# Patient Record
Sex: Male | Born: 1975
Health system: Southern US, Community
[De-identification: ages and names within clinical notes are randomized; demographics above are authoritative.]

## PROBLEM LIST (undated history)

## (undated) HISTORY — PX: TONSILLECTOMY: SUR1361

## (undated) HISTORY — PX: BACK SURGERY: SHX140

## (undated) HISTORY — PX: EYE SURGERY: SHX253

---

## 1997-07-19 ENCOUNTER — Ambulatory Visit (HOSPITAL_COMMUNITY): Admission: RE | Admit: 1997-07-19 | Discharge: 1997-07-19 | Payer: Self-pay

## 2000-12-08 ENCOUNTER — Encounter: Admission: RE | Admit: 2000-12-08 | Discharge: 2000-12-24 | Payer: Self-pay | Admitting: Occupational Medicine

## 2002-08-01 ENCOUNTER — Encounter: Admission: RE | Admit: 2002-08-01 | Discharge: 2002-10-30 | Payer: Self-pay | Admitting: Sports Medicine

## 2002-08-29 ENCOUNTER — Encounter: Admission: RE | Admit: 2002-08-29 | Discharge: 2002-08-29 | Payer: Self-pay | Admitting: Sports Medicine

## 2007-02-13 ENCOUNTER — Emergency Department (HOSPITAL_COMMUNITY): Admission: EM | Admit: 2007-02-13 | Discharge: 2007-02-13 | Payer: Self-pay | Admitting: Family Medicine

## 2008-01-14 ENCOUNTER — Emergency Department (HOSPITAL_COMMUNITY): Admission: EM | Admit: 2008-01-14 | Discharge: 2008-01-14 | Payer: Self-pay | Admitting: Family Medicine

## 2008-03-28 ENCOUNTER — Ambulatory Visit (HOSPITAL_COMMUNITY): Admission: RE | Admit: 2008-03-28 | Discharge: 2008-03-28 | Payer: Self-pay | Admitting: Anesthesiology

## 2008-06-07 ENCOUNTER — Encounter: Admission: RE | Admit: 2008-06-07 | Discharge: 2008-07-10 | Payer: Self-pay | Admitting: Neurosurgery

## 2008-12-29 ENCOUNTER — Encounter: Admission: RE | Admit: 2008-12-29 | Discharge: 2008-12-29 | Payer: Self-pay | Admitting: Neurosurgery

## 2009-11-13 ENCOUNTER — Emergency Department (HOSPITAL_COMMUNITY): Admission: EM | Admit: 2009-11-13 | Discharge: 2009-11-13 | Payer: Self-pay | Admitting: Emergency Medicine

## 2010-02-24 ENCOUNTER — Encounter: Payer: Self-pay | Admitting: Anesthesiology

## 2010-05-14 ENCOUNTER — Inpatient Hospital Stay (INDEPENDENT_AMBULATORY_CARE_PROVIDER_SITE_OTHER)
Admission: RE | Admit: 2010-05-14 | Discharge: 2010-05-14 | Disposition: A | Discharge status: Home / Self Care | Payer: 59 | Source: Ambulatory Visit | Attending: Emergency Medicine | Admitting: Emergency Medicine

## 2010-05-14 ENCOUNTER — Emergency Department (HOSPITAL_COMMUNITY): Payer: 59

## 2010-05-14 ENCOUNTER — Emergency Department (HOSPITAL_COMMUNITY)
Admission: EM | Admit: 2010-05-14 | Discharge: 2010-05-15 | Disposition: A | Discharge status: Home / Self Care | Payer: 59 | Attending: Emergency Medicine | Admitting: Emergency Medicine

## 2010-05-14 DIAGNOSIS — R197 Diarrhea, unspecified: Secondary | ICD-10-CM | POA: Insufficient documentation

## 2010-05-14 DIAGNOSIS — R0602 Shortness of breath: Secondary | ICD-10-CM | POA: Insufficient documentation

## 2010-05-14 DIAGNOSIS — K5289 Other specified noninfective gastroenteritis and colitis: Secondary | ICD-10-CM

## 2010-05-14 DIAGNOSIS — E860 Dehydration: Secondary | ICD-10-CM | POA: Insufficient documentation

## 2010-05-14 DIAGNOSIS — K922 Gastrointestinal hemorrhage, unspecified: Secondary | ICD-10-CM

## 2010-05-14 DIAGNOSIS — R0609 Other forms of dyspnea: Secondary | ICD-10-CM

## 2010-05-14 DIAGNOSIS — R112 Nausea with vomiting, unspecified: Secondary | ICD-10-CM | POA: Insufficient documentation

## 2010-05-14 LAB — DIFFERENTIAL
Basophils Absolute: 0 10*3/uL (ref 0.0–0.1)
Eosinophils Absolute: 0 10*3/uL (ref 0.0–0.7)
Eosinophils Relative: 0 % (ref 0–5)
Lymphocytes Relative: 20 % (ref 12–46)
Neutrophils Relative %: 71 % (ref 43–77)

## 2010-05-14 LAB — CBC
HCT: 43.3 % (ref 39.0–52.0)
Platelets: 231 10*3/uL (ref 150–400)
RBC: 5.31 MIL/uL (ref 4.22–5.81)
RDW: 13 % (ref 11.5–15.5)
WBC: 6.5 10*3/uL (ref 4.0–10.5)

## 2010-05-14 LAB — POCT I-STAT, CHEM 8
BUN: 17 mg/dL (ref 6–23)
Chloride: 99 mEq/L (ref 96–112)
Creatinine, Ser: 1.2 mg/dL (ref 0.4–1.5)
HCT: 47 % (ref 39.0–52.0)
Hemoglobin: 16 g/dL (ref 13.0–17.0)
Potassium: 3.4 mEq/L — ABNORMAL LOW (ref 3.5–5.1)
Potassium: 3.9 mEq/L (ref 3.5–5.1)
Sodium: 137 mEq/L (ref 135–145)
Sodium: 138 mEq/L (ref 135–145)
TCO2: 24 mmol/L (ref 0–100)

## 2010-05-14 LAB — OCCULT BLOOD, POC DEVICE: Fecal Occult Bld: POSITIVE

## 2010-05-15 LAB — URINALYSIS, ROUTINE W REFLEX MICROSCOPIC
Nitrite: NEGATIVE
Specific Gravity, Urine: 1.029 (ref 1.005–1.030)
Urobilinogen, UA: 1 mg/dL (ref 0.0–1.0)
pH: 5.5 (ref 5.0–8.0)

## 2010-09-06 IMAGING — CT CT L SPINE W/ CM
3 of 7 series · 12 of 27 positions shown, 13 images · non-contrast
Comparison: none

CLINICAL DATA: Back pain.  Multilevel degenerative disc disease.
TECHNIQUE: Contiguous axial images were obtained from the inferior
aspect of L2-L3 disc spacethrough S2. Coronal and sagittal
reconstructions of the disc spaces were created.

[Series 2: l spine bone · axial · 0.27mm/px · z∈[-228,-135]mm · 4 of 63 slices shown, 5 images]
[im 13/63  soft-tissue]
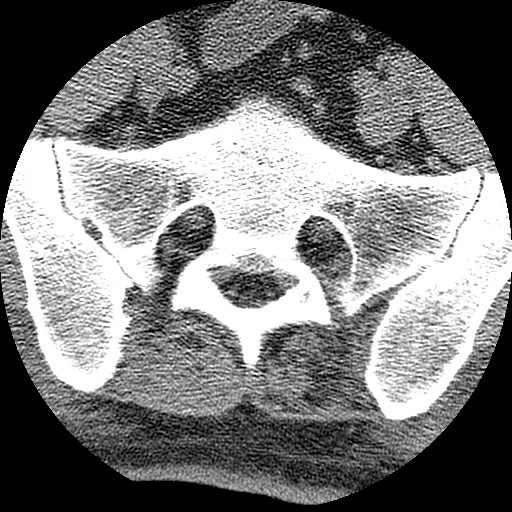
[im 13/63  bone]
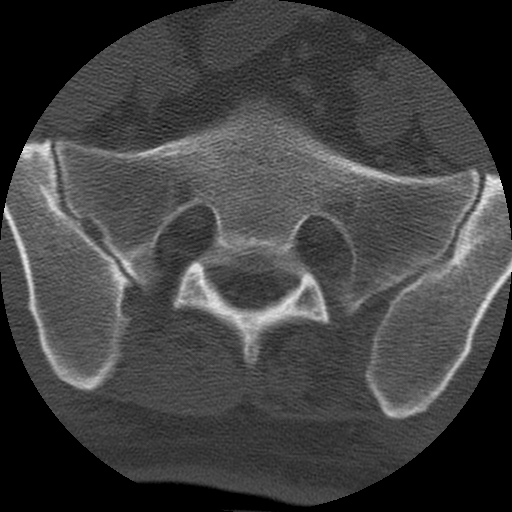
[im 25/63  bone]
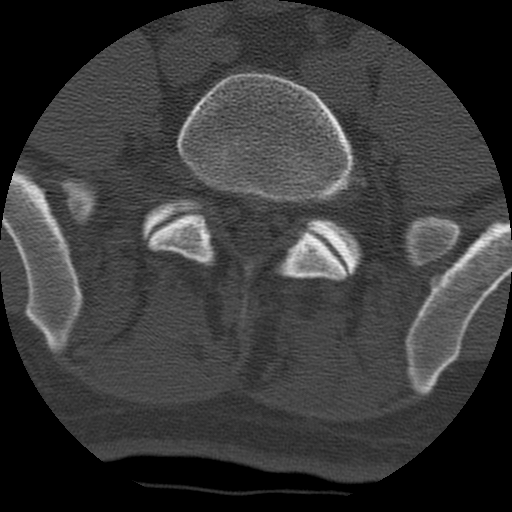
[im 38/63  bone]
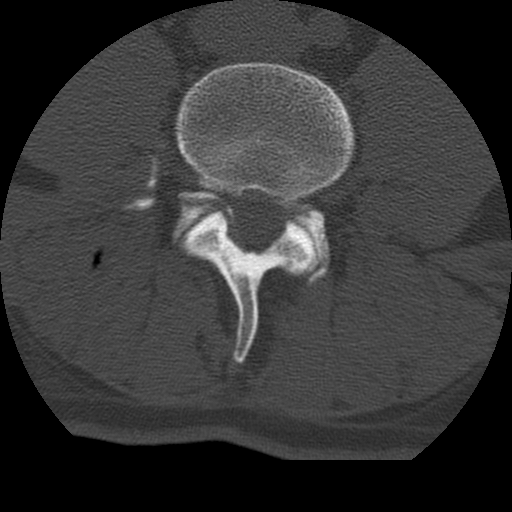
[im 50/63  bone]
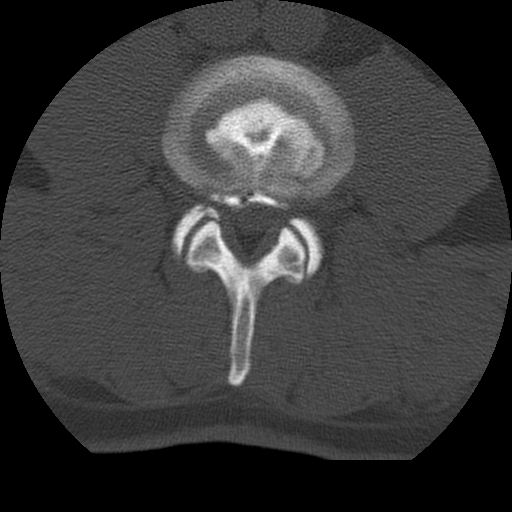

[Series 3: l spine soft · axial · 0.27mm/px · z∈[-220,-145]mm · 3 of 61 slices shown]
[im 16/61  soft-tissue]
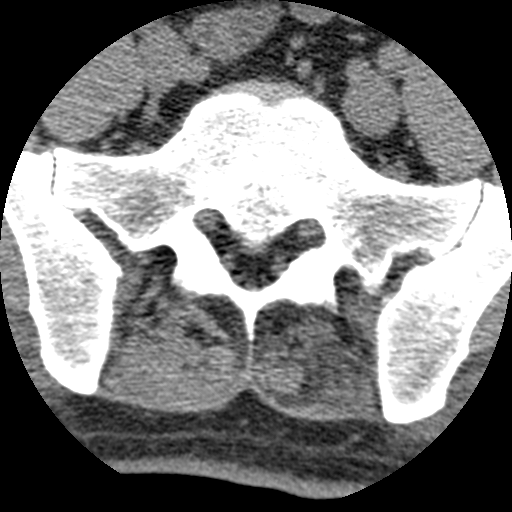
[im 31/61  soft-tissue]
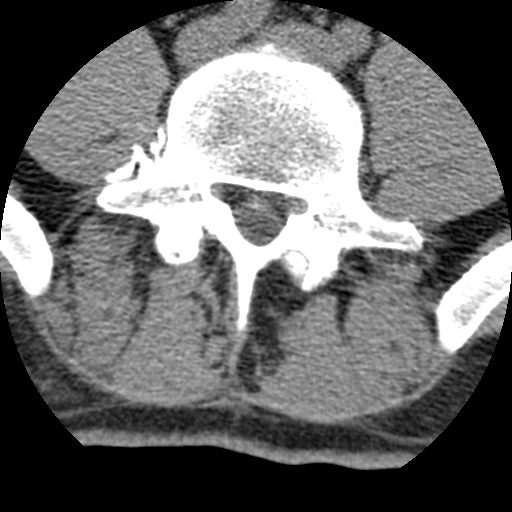
[im 46/61  soft-tissue]
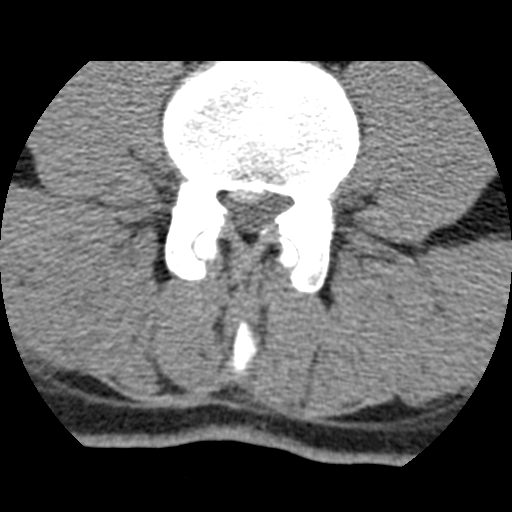

[Series 400: cor · coronal · 0.31mm/px · 5 of 40 slices shown]
[im 7/40  bone]
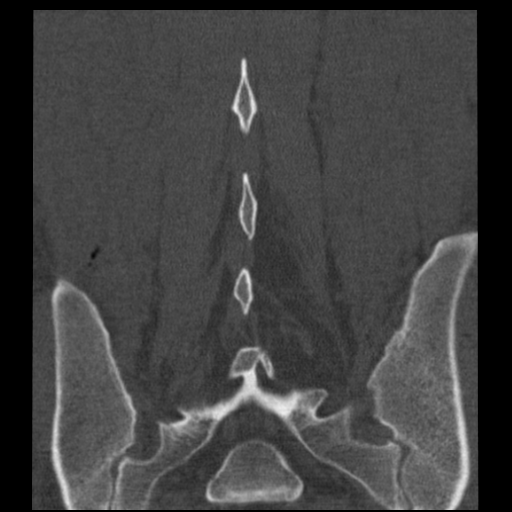
[im 14/40  bone]
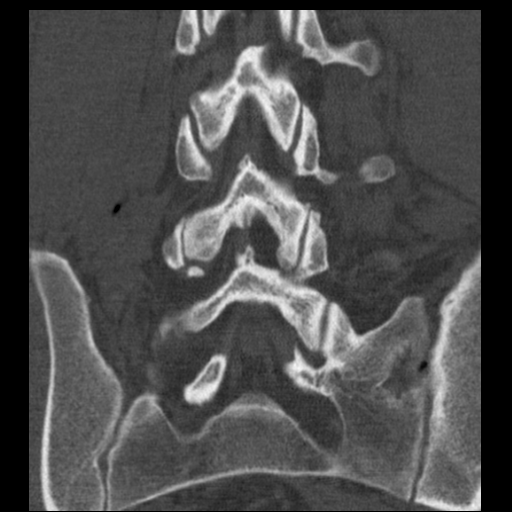
[im 20/40  bone]
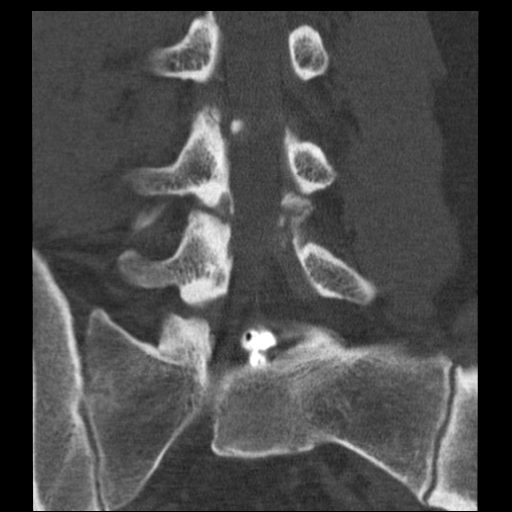
[im 27/40  bone]
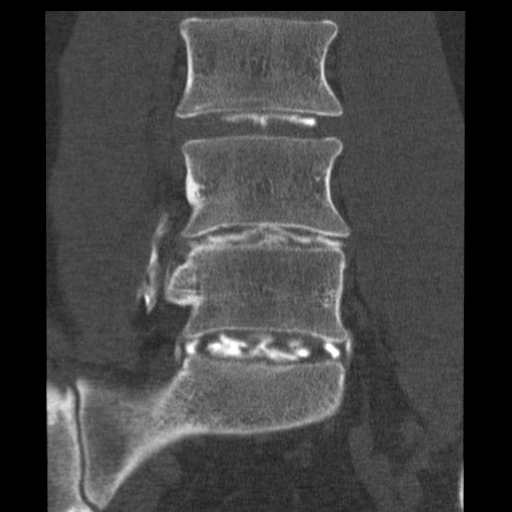
[im 33/40  bone]
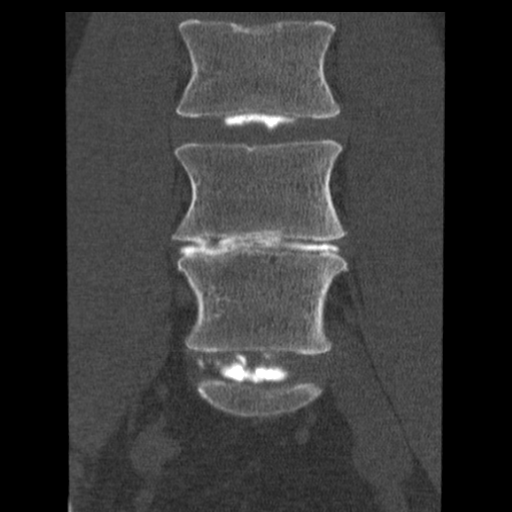

[12 of 27 positions shown; findings below may reference images not displayed]

LUMBAR DISCOGRAM L3-L4, L4-L5, L5-S1

Procedure: After a thorough discussion of risks and benefits of the
procedure, written and oral informed consent was obtained. Specific
risks included discitis, infection of soft tissue and/or bone, and
nontarget injection.  Accelerated progression of degenerative disk
disease was also discussed. General risks of the procedure
including bleeding, infection, and injury to nerves, blood vessels,
and adjacent structures. Verbal consent was obtained by Dr. Jim
prior to the procedure.
Discography was performed via a right posterior oblique approach.
Prior to prep and draped in the usual sterile fashion, the entry
sites were marked using fluoroscopy. Subsequently, using stringent
sterile technique, the back was prepped and draped. 1 gram IV
Cephazolin as an IV antibiotic was completed 30 minutes prior to
needle stick. 1 mL Antibiotic was also mixed with Imnipaque-19K
used for disc injection.

All elements of maximum barrier sterile technique were employed
(cap, mask, sterile gown and gloves, large sterile sheet, hand
hygeine, betadine scrub or alternative skin antisepsis).  After
local anesthesia was provided with 1% lidocaine without epinephrine
to the skin and deeper tissues spinal needles were inserted. Under
meticulous sterile technique, 15, 15, 20 cm 22-gauge needles were
advanced fluoroscopically into the disc spaces of L3-L4 and L4-L5
using intermittent fluoroscopy. One satisfactory needle position
was achieved, contrast was injected.

Fluoroscopy time: 5 minutes 20 seconds

L3-L4: Total volume injected: 1 ml. Opening pressure was 5 PSI.
Pressure endpoint 30 PSI. Pain reported maximally a [DATE]
(1 - 10 scale). Sensation described as [DATE] back pain in the low
back.  [DATE] right anterior leg pain.  The pain was nonconcordant
with every day pain. Pattern of opacification showed  immediate
extension of nuclear contrast into the epidural space the posterior
grade 5 annular tear. Representative images obtained in AP and
lateral projections.
L4-L5: Total volume injected: 2 ml.  Opening pressure was 5  PSI.
Pressure endpoint 40.  PSI. Pain reported four to five [DATE]
(1 - 10 scale). Sensation described as low back pain which was
nonconcordant. Pattern of opacification showed  posterior epidural
spread of contrast consistent with grade 5 annular tear.  Diffuse
degenerative tearing of the annulus and loss of disc height.
Representative images obtained in AP and lateral projections.
L5-S1: Total volume injected: 2 ml. Opening pressure was 10 PSI.
Pressure endpoint 16 PSI. Pain reported [DATE]  (1 - 10
scale). Sensation described as although not exactly concordant, the
pain was similar to every day back pain.  Pain was rated at this
six in the low back and four to five in the left gluteal region.
Pattern of opacification showed  diffuse degenerative posterior
annular tearing without definite epidural spread of contrast.
Representative images obtained in AP and lateral projections.

Sedation was administered with two 4 mg of intravenous Versed, 50
mcg Fentanyl IV. 30 mg IV Toradol.  50 mg of IV Demerol.  12.5 mg
of intramuscular Phenergan.
IMPRESSION: 1.  Technically successful three-level lumbar discogram.
2.  Back pain was most severe at L3-L4 with immediate extravasation
of contrast through a grade 5 annular tear.  Despite the intensity,
this was not concordant with every day back pain.
3.  Nonconcordant L4-L5 back pain despite degenerated disc.
4.  L5-S1 level as described as somewhat concordant however the
radiations to the left gluteal region or atypical.

CT POST-DISCOGRAM
FINDINGS: Paraspinal soft tissues appear within normal limits.

L3-L4: Grade 5 central radial annular tear with epidural spread of
contrast.  Minimal facet degenerative change.  Central canal
appears mildly congenitally narrow.  Broad-based moderate central
posterior disc protrusion associated with central annular tear.
L4-L5: Diffuse disc degeneration and annular tearing.
Extravasation of contrast is present in the left posterolateral
region spreading into the epidural space.  Endplate osteophytes
along with broad-based disc protrusion are present.  Mild to
moderate bilateral facet arthrosis.  Congenitally short pedicles
and bilateral foraminal stenosis.
L5-S1: Diffuse posterior annular tearing with central grade 4
radial tear.  On CT, epidural spread of contrast was present
consistent with degenerative annular tearing allowing for
extravasation.  Shallow broad-based disc protrusion is present.
Facets appear within normal limits.
IMPRESSION: Lumbar degenerative disc disease most pronounced at L4-L5, with
focal annular tearing L3-L4.

## 2012-01-20 IMAGING — CR DG CHEST 2V
2 series · 2 of 2 positions shown · non-contrast
Comparison: None.

CLINICAL DATA: Shortness of breath.

CHEST - 2 VIEW

[w chest pa]
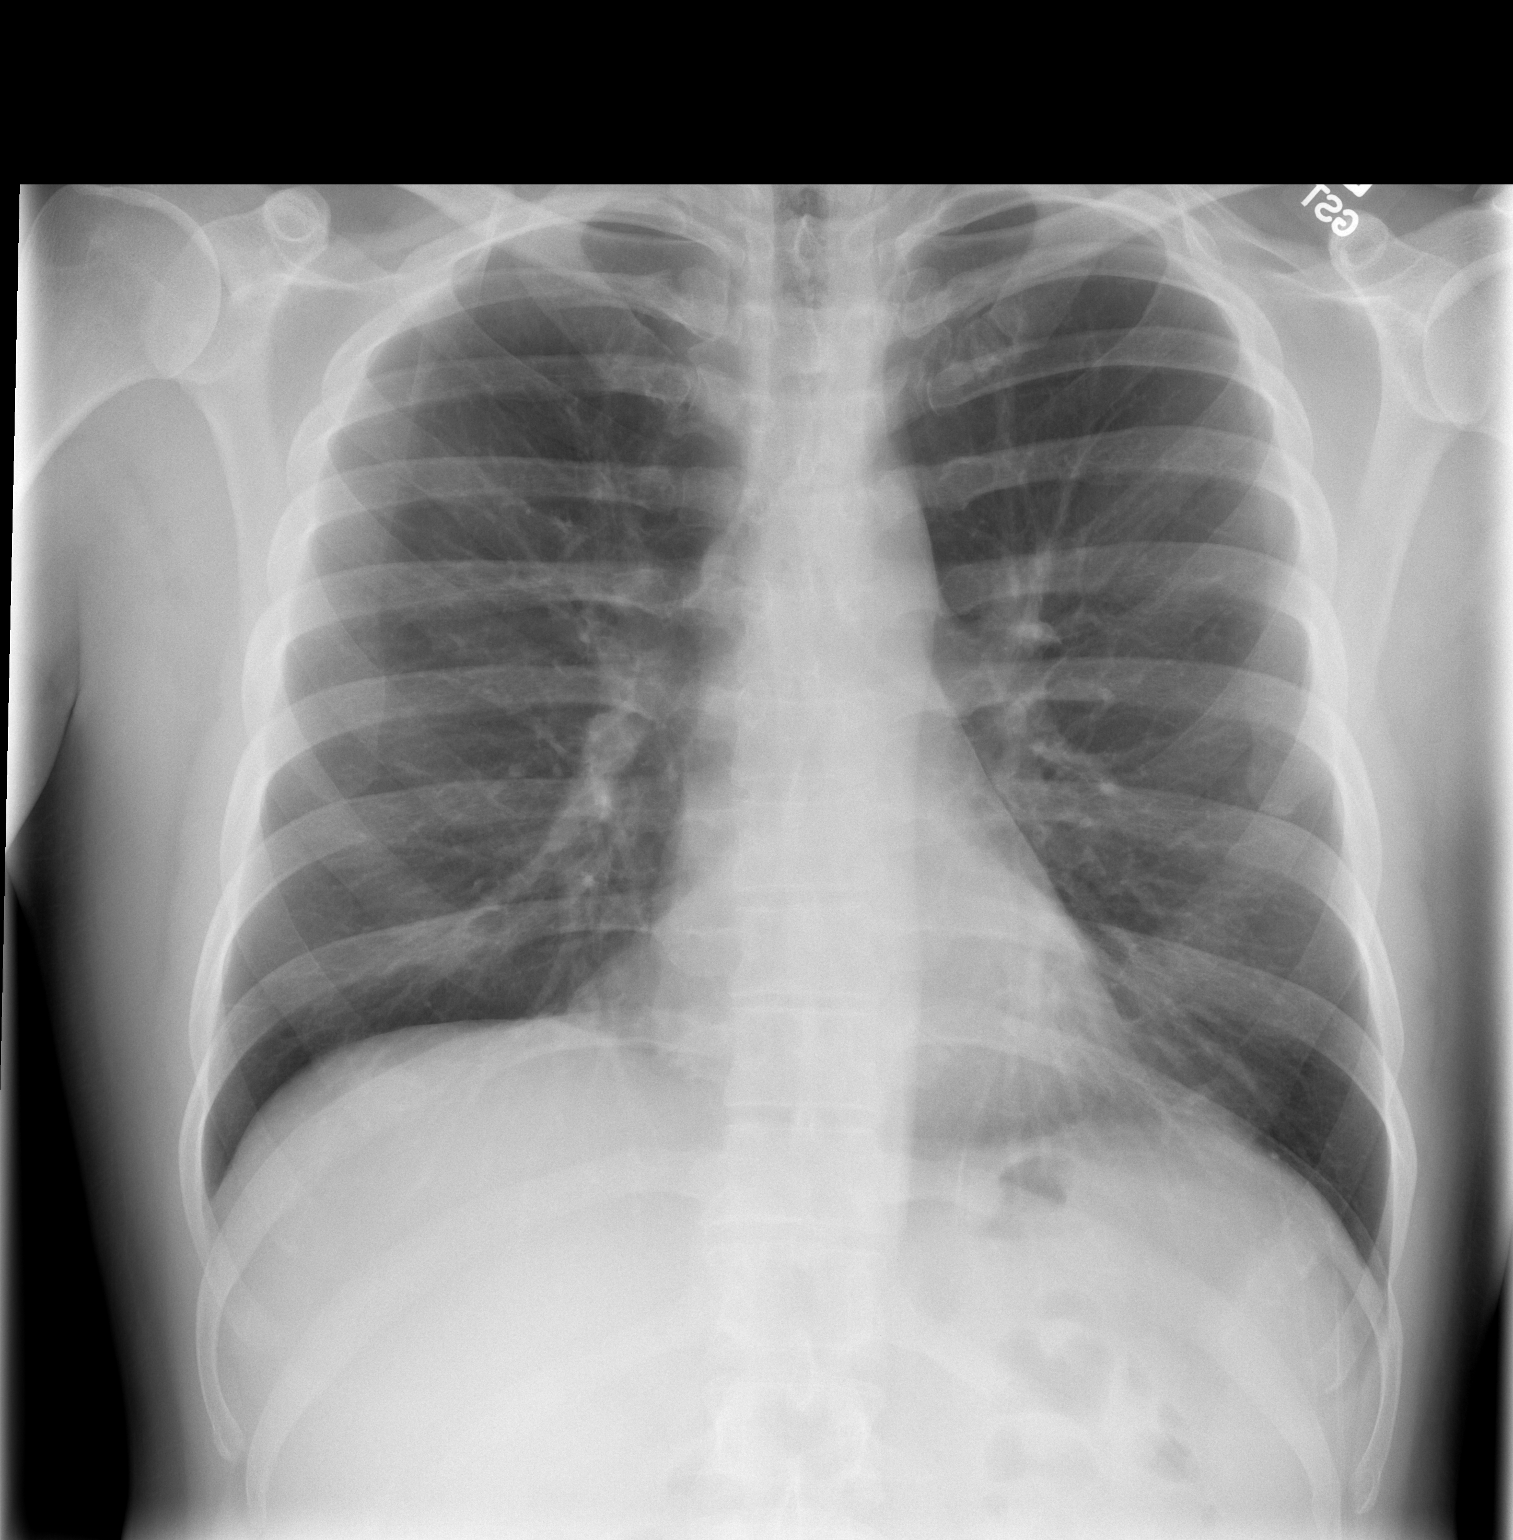

[w chest lat]
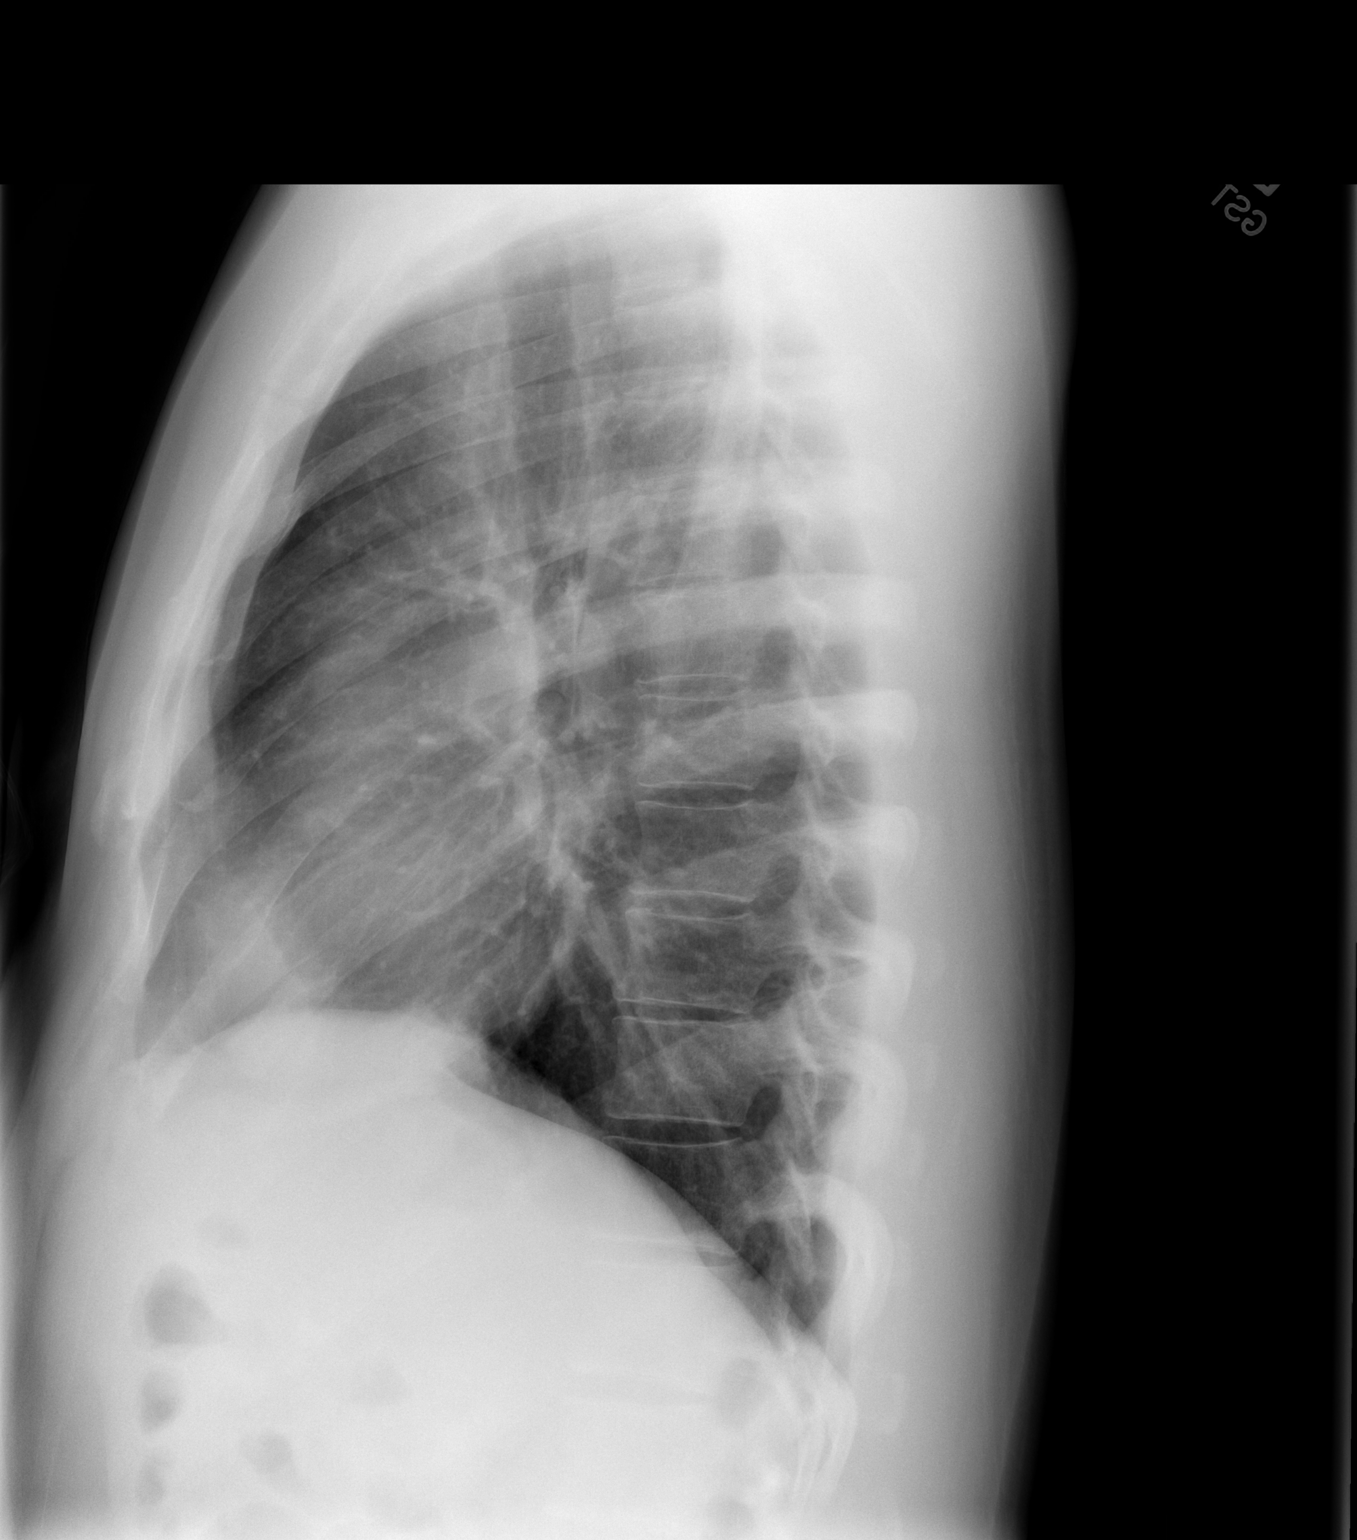

[2 of 2 positions shown; findings below may reference images not displayed]

FINDINGS: The lungs are well-aerated and clear.  There is no
evidence of focal opacification, pleural effusion or pneumothorax.

The heart is normal in size; the mediastinal contour is within
normal limits.  No acute osseous abnormalities are seen.
IMPRESSION: No acute cardiopulmonary process seen.

## 2014-04-14 ENCOUNTER — Encounter (HOSPITAL_COMMUNITY): Payer: Self-pay | Admitting: Emergency Medicine

## 2014-04-14 ENCOUNTER — Emergency Department (HOSPITAL_COMMUNITY): Payer: BLUE CROSS/BLUE SHIELD

## 2014-04-14 ENCOUNTER — Emergency Department (HOSPITAL_COMMUNITY)
Admission: EM | Admit: 2014-04-14 | Discharge: 2014-04-14 | Disposition: A | Discharge status: Home / Self Care | Payer: BLUE CROSS/BLUE SHIELD | Attending: Emergency Medicine | Admitting: Emergency Medicine

## 2014-04-14 DIAGNOSIS — Z791 Long term (current) use of non-steroidal anti-inflammatories (NSAID): Secondary | ICD-10-CM | POA: Diagnosis not present

## 2014-04-14 DIAGNOSIS — J9811 Atelectasis: Secondary | ICD-10-CM | POA: Diagnosis not present

## 2014-04-14 DIAGNOSIS — Z794 Long term (current) use of insulin: Secondary | ICD-10-CM | POA: Insufficient documentation

## 2014-04-14 DIAGNOSIS — R0602 Shortness of breath: Secondary | ICD-10-CM

## 2014-04-14 LAB — CBC
HEMATOCRIT: 42.3 % (ref 39.0–52.0)
HEMOGLOBIN: 14.5 g/dL (ref 13.0–17.0)
MCH: 28 pg (ref 26.0–34.0)
MCHC: 34.3 g/dL (ref 30.0–36.0)
MCV: 81.8 fL (ref 78.0–100.0)
Platelets: 243 10*3/uL (ref 150–400)
RBC: 5.17 MIL/uL (ref 4.22–5.81)
RDW: 13.1 % (ref 11.5–15.5)
WBC: 8.3 10*3/uL (ref 4.0–10.5)

## 2014-04-14 LAB — BASIC METABOLIC PANEL
Anion gap: 9 (ref 5–15)
BUN: 16 mg/dL (ref 6–23)
CALCIUM: 9 mg/dL (ref 8.4–10.5)
CO2: 26 mmol/L (ref 19–32)
CREATININE: 0.99 mg/dL (ref 0.50–1.35)
Chloride: 101 mmol/L (ref 96–112)
GFR calc Af Amer: >90 mL/min (ref >90)
GLUCOSE: 153 mg/dL — AB (ref 70–99)
Potassium: 4.4 mmol/L (ref 3.5–5.1)
SODIUM: 136 mmol/L (ref 135–145)

## 2014-04-14 LAB — I-STAT TROPONIN, ED
TROPONIN I, POC: 0 ng/mL (ref 0.00–0.08)
TROPONIN I, POC: 0 ng/mL (ref 0.00–0.08)

## 2014-04-14 LAB — BRAIN NATRIURETIC PEPTIDE: B Natriuretic Peptide: 18.4 pg/mL (ref 0.0–100.0)

## 2014-04-14 LAB — D-DIMER, QUANTITATIVE (NOT AT ARMC): D DIMER QUANT: 0.38 ug{FEU}/mL (ref 0.00–0.48)

## 2014-04-14 NOTE — ED Notes (Signed)
Patient was going to bed last night at approx 2330 and had an episode of SOB with chest tightness. Patient went to bed and woke up again 30 minutes ago with same symptoms. Patient reports 2/10 chest tightness, Nausea but no emesis.

## 2014-04-14 NOTE — ED Provider Notes (Signed)
CSN: [id number]     Arrival date & time 04/14/14  0508 History   None    Chief Complaint  Patient presents with  . Shortness of Breath     (Consider location/radiation/quality/duration/timing/severity/associated sxs/prior Treatment) HPI Comments: Pt states that he has onset of sob and indigestion (burning in chest) last night that lasted about 30 minutes. He states that he felt like his sugar might be off and when he checked it was normal. At 4 am he woke up with the same symptoms and he sugar was again normal. Had some nausea and  Heaviness in the arm.   Patient is a 39 y.o. male presenting with shortness of breath. The history is provided by the patient. No language interpreter was used.  Shortness of Breath Severity:  Moderate Onset quality:  Sudden Timing:  Intermittent Progression:  Unchanged Chronicity:  New Relieved by:  Nothing Worsened by:  Nothing tried Ineffective treatments:  None tried Associated symptoms: no cough and no fever     History reviewed. No pertinent past medical history. History reviewed. No pertinent past surgical history. No family history on file. History  Substance Use Topics  . Smoking status: Never Smoker   . Smokeless tobacco: Never Used  . Alcohol Use: Yes    Review of Systems  Constitutional: Negative for fever.  Respiratory: Positive for shortness of breath. Negative for cough.   All other systems reviewed and are negative.     Allergies  Prednisone  Home Medications   Prior to Admission medications   Medication Sig Start Date End Date Taking? Authorizing Provider  celecoxib (CELEBREX) 100 MG capsule Take 100 mg by mouth 2 (two) times daily.    Yes Historical Provider, MD  HYDROcodone-acetaminophen (NORCO/VICODIN) 5-325 MG per tablet Take 1 tablet by mouth every 4 (four) hours as needed for moderate pain.  06/18/13  Yes Historical Provider, MD  Insulin Human (INSULIN PUMP) SOLN Inject 1 each into the skin continuous.   Yes  Historical Provider, MD  methocarbamol (ROBAXIN) 500 MG tablet Take 500 mg by mouth every 6 (six) hours as needed for muscle spasms.    Yes Historical Provider, MD   BP 129/84 mmHg  Pulse 95  Temp(Src) 98.3 F (36.8 C) (Oral)  Resp 15  Ht  (1.778 m)  Wt 190 lb (86.183 kg)  BMI 27.26 kg/m2  SpO2 97% Physical Exam  Constitutional: He is oriented to person, place, and time. He appears well-developed and well-nourished.  HENT:  Head: Normocephalic and atraumatic.  Cardiovascular: Normal rate and regular rhythm.   Pulmonary/Chest: Effort normal and breath sounds normal. He exhibits no tenderness.  Abdominal: Soft. Bowel sounds are normal. There is no tenderness.  Musculoskeletal: Normal range of motion.  Neurological: He is alert and oriented to person, place, and time.  Skin: Skin is dry.  Psychiatric: He has a normal mood and affect.  Nursing note and vitals reviewed.   ED Course  Procedures (including critical care time) Labs Review Labs Reviewed  BASIC METABOLIC PANEL - Abnormal; Notable for the following:    Glucose, Bld 153 (*)    All other components within normal limits  CBC  BRAIN NATRIURETIC PEPTIDE  D-DIMER, QUANTITATIVE  I-STAT TROPOININ, ED  Rosezena Sensor, ED    Imaging Review Dg Chest Port 1 View  04/14/2014   CLINICAL DATA:  Central chest pain and shortness of breath for 1 day.  EXAM: PORTABLE CHEST - 1 VIEW  COMPARISON:  05/14/2010  FINDINGS: The cardiomediastinal contours  are normal. Minimal left basilar atelectasis Pulmonary vasculature is normal. No consolidation, pleural effusion, or pneumothorax. No acute osseous abnormalities are seen.  IMPRESSION: Minimal left basilar atelectasis.  Otherwise clear lungs.   Electronically Signed   By: Rubye OaksMelanie  Ehinger M.D.   On: 04/14/2014 06:25     EKG Interpretation   Date/Time:  Friday April 14 2014 05:14:24 EST Ventricular Rate:  99 PR Interval:  127 QRS Duration: 92 QT Interval:  335 QTC Calculation:  430 R Axis:   96 Text Interpretation:  Sinus rhythm Borderline right axis deviation  Baseline wander in lead(s) II III aVF No significant change since last  tracing Confirmed by YAO  MD, DAVID ([id number]) on 04/14/2014 6:49:53 AM      MDM   Final diagnoses:  Shortness of breath  Atelectasis, left   Doubt cardiac in nature. Pt negative delta trop. Discussed findings with pt. Pt is okay to follow up with pcp. Discussed return precaution. Pt was dimer negative. Atelectasis noted on x-ray. Which could explain some symptoms.      Teressa LowerVrinda Falynn Ailey, NP 04/14/14 [id number]  Richardean Canalavid H Yao, MD 04/14/14 2256

## 2014-04-14 NOTE — Discharge Instructions (Signed)
Follow up with your doctor or return here for continued or worsening symptoms Atelectasis Atelectasis is a collapse of the small air sacs in the lungs (alveoli). When this occurs, all or part of a lung collapses and becomes airless. It can be caused by various things and is a common problem after surgery. The severity of atelectasis will vary depending on the size of the area involved and the underlying cause of the condition. CAUSES  There are multiple causes for atelectasis:   Shallow breathing, particularly if there is an injury to your chest wall or abdomen that makes it painful to take a deep breath. This commonly occurs after surgery.  Obstruction of your airways (bronchi or bronchioles). This may be caused by a buildup of mucus (mucus plug), tumors, blood clots (pulmonary embolus), or inhaled foreign bodies. Mucus plugs occur when the lungs do not expand enough to get rid of mucus.  Outside pressure on the lung. This may be caused by tumors, fluid (pleural effusion), or a leakage of air between the lung and rib cage (pneumothorax).   Infections such as pneumonia.  Scarring in lung tissue left over from previous infection or injury.  Some diseases such as cystic fibrosis. SIGNS AND SYMPTOMS  Often, atelectasis will have no symptoms. When symptoms occur, they include:  Shortness of breath.   Bluish color to your nails, lips, or mouth (cyanosis). DIAGNOSIS  Your health care provider may suspect atelectasis based on symptoms and physical findings. A chest X-ray may be done to confirm the diagnosis. More specialized X-ray exams are sometimes required.  TREATMENT  Treatment will depend on the cause of the atelectasis. Treatment may include:  Purposeful coughing to loosen mucus plugs in the lungs.  Chest physiotherapy. This consists of clapping or percussion on the chest over the lungs to further loosen mucus plugs.  Postural drainage techniques. This involves positioning your body  so your head is lower than your chest. HOME CARE INSTRUCTIONS  Practice relaxed deep breathing whenever you are sitting down. A good technique is to take a few relaxed deep breaths each time a commercial comes on if you are watching television.  If you were given a deep breathing device (such as an incentive spirometer) or a mucus clearance device, use this regularly as directed by your health care provider.  Try to cough several times a day as directed by your health care provider.  Perform any chest physiotherapy or postural drainage techniques as directed by your health care provider. If necessary, have someone (such as a family member) assist you with these techniques.  When lying down, lie on the unaffected side to encourage mucus drainage.  Stay physically active as much as possible. SEEK IMMEDIATE MEDICAL CARE IF:   You develop increasing problems with your breathing.   You develop severe chest pain.   You develop severe coughing, or you cough up blood.   You have a fever or persistent symptoms for more than 2-3 days.   You have a fever and your symptoms suddenly get worse.  MAKE SURE YOU:  Understand these instructions.  Will watch your condition.  Will get help right away if you are not doing well or get worse. Document Released: 01/20/2005 Document Revised: 01/25/2013 Document Reviewed: 07/28/2012 [facility] Patient Information 2015 [employer], [place]. This information is not intended to replace advice given to you by your health care provider. Make sure you discuss any questions you have with your health care provider.

## 2014-04-14 NOTE — ED Notes (Signed)
Pt undressed, in gown, on monitor, continuous pulse oximetry and blood pressure cuff 

## 2015-12-21 IMAGING — CR DG CHEST 1V PORT
1 series · 1 of 1 positions shown · non-contrast
Comparison: 05/14/2010

CLINICAL DATA: Central chest pain and shortness of breath for 1
day.

EXAM:
PORTABLE CHEST - 1 VIEW

[AP]
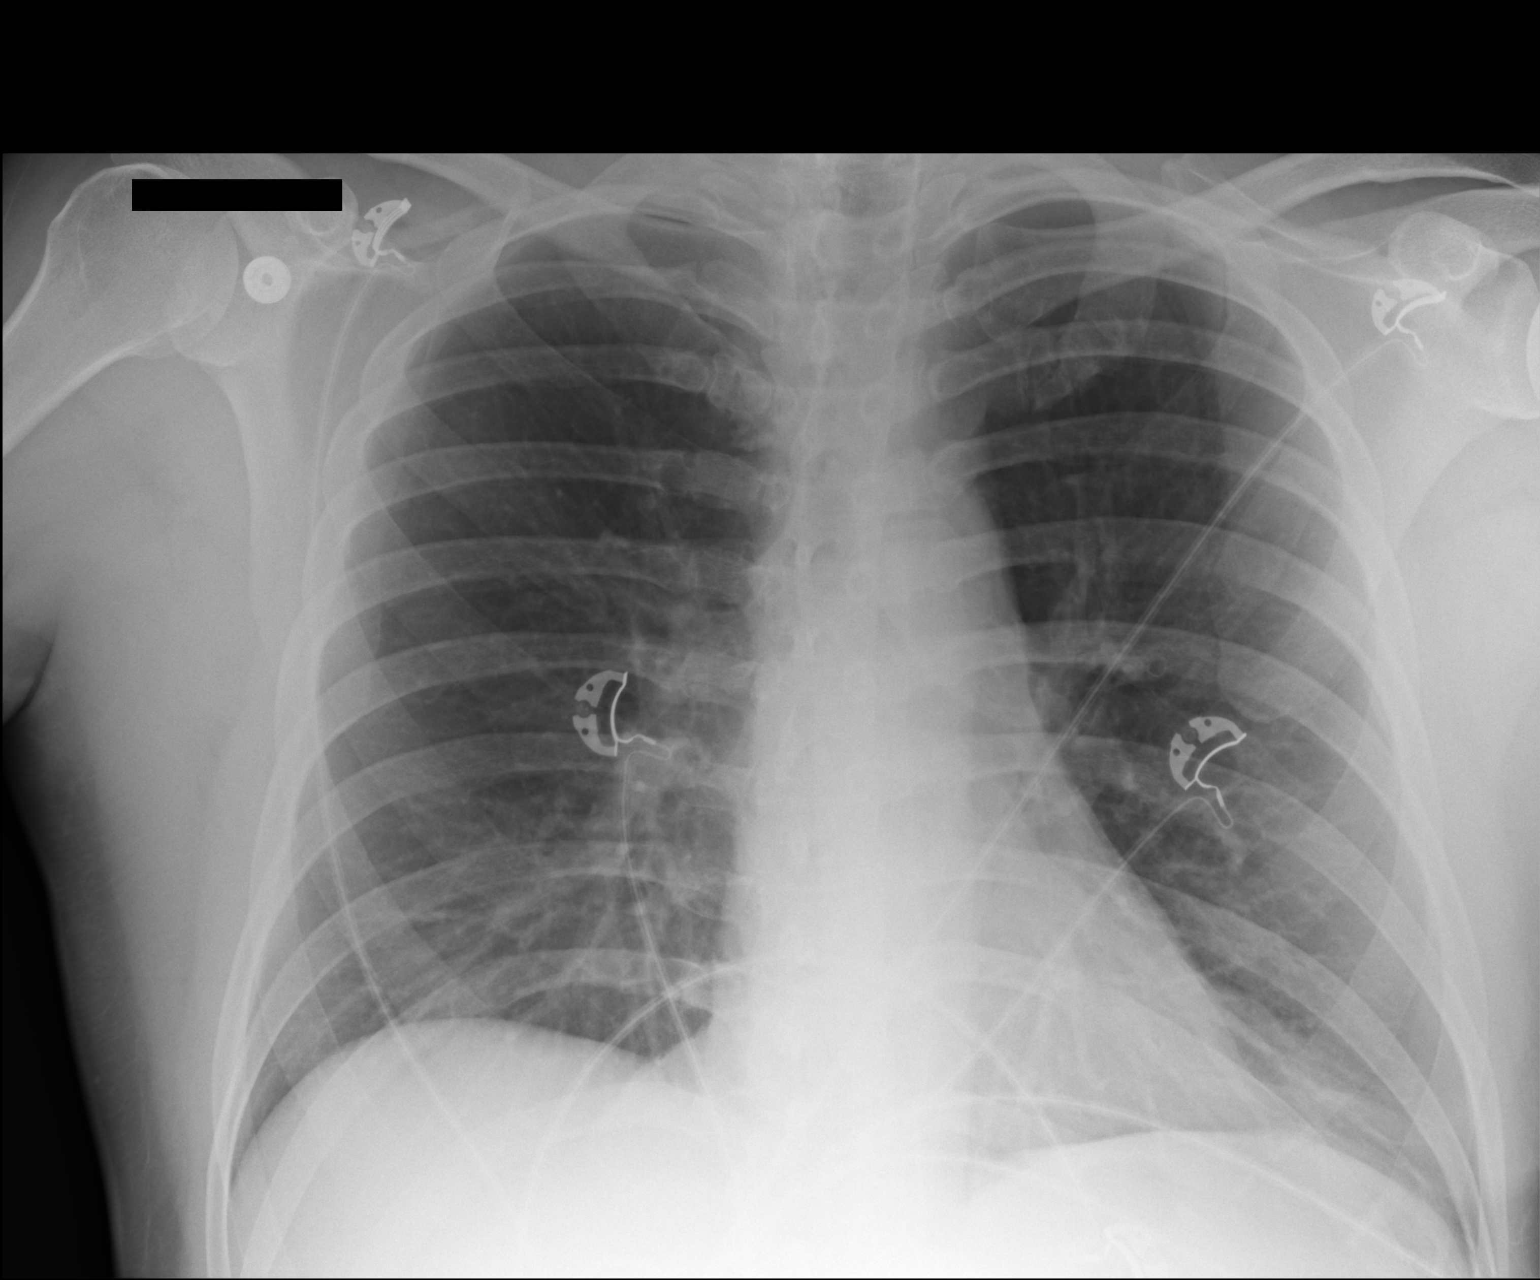

[1 of 1 positions shown; findings below may reference images not displayed]

FINDINGS: The cardiomediastinal contours are normal. Minimal left basilar
atelectasis Pulmonary vasculature is normal. No consolidation,
pleural effusion, or pneumothorax. No acute osseous abnormalities
are seen.
IMPRESSION: Minimal left basilar atelectasis.  Otherwise clear lungs.

## 2016-12-04 DIAGNOSIS — E109 Type 1 diabetes mellitus without complications: Secondary | ICD-10-CM | POA: Diagnosis not present

## 2017-01-30 DIAGNOSIS — Z794 Long term (current) use of insulin: Secondary | ICD-10-CM | POA: Diagnosis not present

## 2017-01-30 DIAGNOSIS — E109 Type 1 diabetes mellitus without complications: Secondary | ICD-10-CM | POA: Diagnosis not present

## 2017-04-03 DIAGNOSIS — Z794 Long term (current) use of insulin: Secondary | ICD-10-CM | POA: Diagnosis not present

## 2017-04-03 DIAGNOSIS — E109 Type 1 diabetes mellitus without complications: Secondary | ICD-10-CM | POA: Diagnosis not present

## 2017-04-09 DIAGNOSIS — E109 Type 1 diabetes mellitus without complications: Secondary | ICD-10-CM | POA: Diagnosis not present

## 2017-07-02 DIAGNOSIS — E139 Other specified diabetes mellitus without complications: Secondary | ICD-10-CM | POA: Diagnosis not present

## 2017-07-06 DIAGNOSIS — Z794 Long term (current) use of insulin: Secondary | ICD-10-CM | POA: Diagnosis not present

## 2017-07-06 DIAGNOSIS — E109 Type 1 diabetes mellitus without complications: Secondary | ICD-10-CM | POA: Diagnosis not present

## 2017-07-30 DIAGNOSIS — E139 Other specified diabetes mellitus without complications: Secondary | ICD-10-CM | POA: Diagnosis not present

## 2017-08-20 DIAGNOSIS — Z794 Long term (current) use of insulin: Secondary | ICD-10-CM | POA: Diagnosis not present

## 2017-08-20 DIAGNOSIS — E109 Type 1 diabetes mellitus without complications: Secondary | ICD-10-CM | POA: Diagnosis not present

## 2017-10-03 DIAGNOSIS — E1065 Type 1 diabetes mellitus with hyperglycemia: Secondary | ICD-10-CM | POA: Diagnosis not present

## 2017-10-09 DIAGNOSIS — E109 Type 1 diabetes mellitus without complications: Secondary | ICD-10-CM | POA: Diagnosis not present

## 2017-10-09 DIAGNOSIS — Z794 Long term (current) use of insulin: Secondary | ICD-10-CM | POA: Diagnosis not present

## 2017-11-10 DIAGNOSIS — E139 Other specified diabetes mellitus without complications: Secondary | ICD-10-CM | POA: Diagnosis not present

## 2018-01-11 DIAGNOSIS — E162 Hypoglycemia, unspecified: Secondary | ICD-10-CM | POA: Diagnosis not present

## 2018-01-11 DIAGNOSIS — E109 Type 1 diabetes mellitus without complications: Secondary | ICD-10-CM | POA: Diagnosis not present

## 2018-01-11 DIAGNOSIS — E139 Other specified diabetes mellitus without complications: Secondary | ICD-10-CM | POA: Diagnosis not present

## 2018-01-11 DIAGNOSIS — Z9641 Presence of insulin pump (external) (internal): Secondary | ICD-10-CM | POA: Diagnosis not present

## 2018-01-11 DIAGNOSIS — Z794 Long term (current) use of insulin: Secondary | ICD-10-CM | POA: Diagnosis not present

## 2018-02-09 DIAGNOSIS — E1065 Type 1 diabetes mellitus with hyperglycemia: Secondary | ICD-10-CM | POA: Diagnosis not present

## 2018-04-02 DIAGNOSIS — Z794 Long term (current) use of insulin: Secondary | ICD-10-CM | POA: Diagnosis not present

## 2018-04-02 DIAGNOSIS — E109 Type 1 diabetes mellitus without complications: Secondary | ICD-10-CM | POA: Diagnosis not present

## 2018-04-13 LAB — HEMOGLOBIN A1C
Hemoglobin A1C: 9.8
Hemoglobin A1C: 9.8

## 2018-05-14 ENCOUNTER — Encounter: Payer: Self-pay | Admitting: Family Medicine

## 2018-05-14 ENCOUNTER — Ambulatory Visit (INDEPENDENT_AMBULATORY_CARE_PROVIDER_SITE_OTHER): Payer: 59 | Admitting: Family Medicine

## 2018-05-14 ENCOUNTER — Other Ambulatory Visit: Payer: Self-pay

## 2018-05-14 VITALS — Temp 98.0°F | Wt 200.0 lb

## 2018-05-14 DIAGNOSIS — R21 Rash and other nonspecific skin eruption: Secondary | ICD-10-CM | POA: Diagnosis not present

## 2018-05-14 DIAGNOSIS — M545 Low back pain, unspecified: Secondary | ICD-10-CM | POA: Insufficient documentation

## 2018-05-14 DIAGNOSIS — I10 Essential (primary) hypertension: Secondary | ICD-10-CM | POA: Insufficient documentation

## 2018-05-14 DIAGNOSIS — E1159 Type 2 diabetes mellitus with other circulatory complications: Secondary | ICD-10-CM | POA: Diagnosis not present

## 2018-05-14 DIAGNOSIS — E1169 Type 2 diabetes mellitus with other specified complication: Secondary | ICD-10-CM | POA: Diagnosis not present

## 2018-05-14 DIAGNOSIS — G8929 Other chronic pain: Secondary | ICD-10-CM

## 2018-05-14 DIAGNOSIS — E139 Other specified diabetes mellitus without complications: Secondary | ICD-10-CM | POA: Diagnosis not present

## 2018-05-14 DIAGNOSIS — E785 Hyperlipidemia, unspecified: Secondary | ICD-10-CM

## 2018-05-14 MED ORDER — DOXYCYCLINE HYCLATE 100 MG PO TABS: 100.0000 mg | ORAL_TABLET | Freq: Two times a day (BID) | ORAL | 0 refills | Status: DC

## 2018-05-14 NOTE — Assessment & Plan Note (Signed)
S: history of low back pain. Takes aleve PRN. In past- Thyra Breed, MD- pain management and has had some injections. 2 back surgeries- used to work at [employer] A/P: thankfully better controlled recently- will plan on creatinine next visit given use of nsaids (will try to quantify amount more next visit as well)

## 2018-05-14 NOTE — Assessment & Plan Note (Signed)
S: poorly controlled on last check. He reports diagnosis 5 years ago and has been managed by [facility]. Originally diagnosed as Type II and later LADA - he is insulin dependent.  Has had metformin 500mg  ER added. He is on [employer] control IQ pump- does 90% of his basal metabolism- sugar 140 right now. On CGM.  - reports over the years a few skin infections- thought related to DM- often on abdomen  Last a1c in march was 9.8- team will abstract this  A/P: poor control but working with [facility] to correct this.  -plans on cpe in 3 months if able with covid 19- likely get urine microalbumin/creatinine ratio and records from chronic medical conditions

## 2018-05-14 NOTE — Progress Notes (Signed)
Phone [phone]   Subjective:  Virtual visit via Video note. Patient presents today to establish care.  Prior patient of Dr. Donette Larry (years ago). Chief complaint: Chief Complaint  Patient presents with  . Rash   This visit type was conducted due to national recommendations for restrictions regarding the COVID-19 Pandemic (e.g. social distancing).  This format is felt to be most appropriate for this patient at this time balancing risks to patient and risks to population by having him in for in person visit.  All issues noted in this document were discussed and addressed.  No physical exam was performed (except for noted visual exam or audio findings with Telehealth visits).  The patient has consented to conduct a Telehealth visit and understands insurance will be billed.   Our team/I connected with Elta Guadeloupe on 05/14/18 at  9:20 AM EDT by a video enabled telemedicine application ([email or web address]) and verified that I am speaking with the correct person using two identifiers.  Location patient: [facility] Location provider: [facility], office at home Persons participating in the virtual visit:  patient  Our team/I discussed the limitations of evaluation and management by telemedicine and the availability of in person appointments. In light of current covid-19 pandemic, patient also understands that we are trying to protect them by minimizing in office contact if at all possible.  The patient expressed consent for telemedicine visit and agreed to proceed. Patient understands insurance will be billed.   The following were reviewed and entered/updated in epic: History reviewed. No pertinent past medical history. Patient Active Problem List   Diagnosis Date Noted  . Diabetes mellitus type 1.5 (HCC) 05/14/2018  . Hypertension associated with diabetes (HCC) 05/14/2018  . Hyperlipidemia associated with type 2 diabetes mellitus (HCC) 05/14/2018  . Low back pain 05/14/2018   Past Surgical History:   Procedure Laterality Date  . BACK SURGERY     x2  . EYE SURGERY     as child- strabismus. Sees Dr. Charlotte Sanes  . TONSILLECTOMY     in 63s    Family History  Problem Relation Age of Onset  . Osteoarthritis Mother   . Rheumatic fever Father        both valves replaced at [facility]  . Hypertension Father   . Hyperlipidemia Father   . Stroke Father        embolus  . Healthy Brother   . Gastric cancer Paternal Grandfather     Medications- reviewed and updated Current Outpatient Medications  Medication Sig Dispense Refill  . lisinopril (PRINIVIL,ZESTRIL) 10 MG tablet Take 20 mg by mouth daily.    . metFORMIN (GLUCOPHAGE-XR) 500 MG 24 hr tablet Take 500 mg by mouth daily.    . celecoxib (CELEBREX) 100 MG capsule Take 100 mg by mouth 2 (two) times daily.     Marland Kitchen doxycycline (VIBRA-TABS) 100 MG tablet Take 1 tablet (100 mg total) by mouth 2 (two) times daily. 20 tablet 0  . Insulin Human (INSULIN PUMP) SOLN Inject 1 each into the skin continuous.    . methocarbamol (ROBAXIN) 500 MG tablet Take 500 mg by mouth every 6 (six) hours as needed for muscle spasms.     . rosuvastatin (CRESTOR) 5 MG tablet Take 5 mg by mouth daily.     No current facility-administered medications for this visit.     Allergies-reviewed and updated Allergies  Allergen Reactions  . Prednisone Other (See Comments)    Elevated blood pressure    Social History   Social History  Narrative   Married. 532 kids 43 year old and 43 year old in 2020.       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  HENT: Negative for hearing loss and tinnitus.   Eyes: Negative for blurred vision and double vision.  Respiratory: Negative  for shortness of breath and wheezing.   Cardiovascular: Negative for chest pain and palpitations.  Gastrointestinal: Negative for heartburn, nausea and vomiting.  Genitourinary: Negative for dysuria and urgency.  Musculoskeletal: Positive for back pain. Negative for myalgias and neck pain.  Skin: Positive for rash (with tick bite). Negative for itching.  Neurological: Negative for tingling and headaches.  Endo/Heme/Allergies: Positive for environmental allergies (on allegra). Negative for polydipsia. Does not bruise/bleed easily.  Psychiatric/Behavioral: Negative for depression, hallucinations, substance abuse and suicidal ideas.   Objective  Objective:  Temp 98 F (36.7 C)   Wt 200 lb (90.7 kg)   BMI 28.70 kg/m  Gen: NAD, resting comfortably in his home HEENT:Normal nares, normal external ears, white sclera Neck: no visible thyromegaly or lymphadenopathy Lungs: non labored breathing- normal respiratory rate Abdomen: nondistended abdomen, no reported pain Ext: no edema in upper legs Skin: appears dry. On right upper thigh appears to have 7 x 7 cm area (at least). Central red area surrounded by almost flresh colored skin surrounded by another circular/darker red band concerning for erythema migrans  Neuro: Walks around without difficulty, normal upper extremity movement, normal facial movement   Assessment and Plan:   # Rash S: Has been working out in the yard more. Saturday or Sunday noted a bite on upper right thigh- thought he had a bug bite. He was finishing clindamycin for ingrown toenail that finished yesterday. He saw ticks in yard but he didn't see anything embedded on him.    Kenard GowerDrew sharpie around it Tuesday night- went slightly around line but got whiter in the middle and redder on the outside  Did telehealth visit last night through [facility]. They were concerned about tick bite/erythema migrans and stated they could not treat that through telehealth.   Patient states are is  Slightly itchy, slightly sore to the touch.   Lateral aspect 3-4 inches above his waist. It is at least 7 cm x 7 cm with central clearing A/P: Concern with rash that couldbe consistent with erythema migrans is for lyme disease. Could also have a localized cellulitis- I think a course of doxycycline is reasonable to trial for 10 days which would cover for lyme disease. Patient asks about testing but we discussed by time we had testing back - he likely would already have finished treatment so we will treat empirically instead (our lab was closed today anyway so would not have access and earliest would be 3 days out). Patient knows it may take 48 hours for doxycycline to slow spread if is cellulitis- he will call us if has systemic symptoms prior to that or if he has worsening symptoms Monday or beyond. - could consider keflex if no improvement as that would have better staph coverage    Diabetes mellitus type 1.5 (HCC) S: poorly controlled on last check. He reports diagnosis 5 years ago and has been managed by  [facility]. Originally diagnosed as Type II and later LADA - he is insulin dependent.  Has had metformin  ER added. He is on Tandem control IQ pump- does 90% of his basal metabolism- sugar 140 right now. On CGM.  - reports over the years a few skin infections- thought related to DM- often on abdomen  Last a1c in march was 9.8- team will abstract this  A/P: poor control but working with [facility] to correct this.  -plans on cpe in 3 months if able with covid 19- likely get urine microalbumin/creatinine ratio and records from chronic medical conditions      Hypertension associated with diabetes (HCC) S: controlled on lisinopril  on checks at [facility]. We did not check BP today A/P: Stable. Continue current medications. Will get in person BP in 3 months hopefully with CPE     Hyperlipidemia associated with type 2 diabetes mellitus (HCC) S: on crestor 5 mg- reports has had reasonable control A/P:  last report from [facility] I see is 2018 so we will plan on updating that at CPE . Hopeful good control.    Low back pain S: history of low back pain. Takes aleve PRN. In past- Thyra Breed, MD- pain management and has had some injections. 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 doxycycline (VIBRA-TABS) 100 MG tablet    Sig: Take 1 tablet (100 mg total) by mouth 2 (two) times daily.    Dispense:  20 tablet    Refill:  0   Time Stamp The duration of face-to-face time during this visit was greater than 30 minutes. Greater than 50% of this time was spent in counseling, explanation of diagnosis, planning of further management, and/or coordination of care including discussion of lyme vs. RMSF, indications for treatment, discussion of telemedicine limitations, discussing future health maintenance needs/physical.     Return precautions advised. Tana Conch, MD

## 2018-05-14 NOTE — Assessment & Plan Note (Signed)
S: on crestor 5 mg- reports has had reasonable control A/P: last report from [facility] I see is 2018 so we will plan on updating that at CPE . Hopeful good control.

## 2018-05-14 NOTE — Patient Instructions (Addendum)
Health Maintenance Due  Topic Date Due  . HEMOGLOBIN A1C  09/20/1975  . PNEUMOCOCCAL POLYSACCHARIDE VACCINE AGE 43-64 HIGH RISK  11/28/1977  . FOOT EXAM  11/28/1985  . OPHTHALMOLOGY EXAM  11/28/1985  . HIV Screening  11/29/1990  . TETANUS/TDAP  11/29/1994   Video visit

## 2018-05-14 NOTE — Assessment & Plan Note (Addendum)
S: controlled on lisinopril 10mg  on checks at [facility]. We did not check BP today A/P: Stable. Continue current medications. Will get in person BP in 3 months hopefully with CPE

## 2018-05-17 ENCOUNTER — Encounter: Payer: Self-pay | Admitting: Family Medicine

## 2018-05-17 NOTE — Progress Notes (Signed)
Done

## 2018-07-08 ENCOUNTER — Encounter: Payer: Self-pay | Admitting: Family Medicine

## 2018-07-09 ENCOUNTER — Other Ambulatory Visit: Payer: Self-pay | Admitting: Physical Therapy

## 2018-07-09 DIAGNOSIS — M542 Cervicalgia: Secondary | ICD-10-CM

## 2018-07-13 ENCOUNTER — Telehealth: Payer: Self-pay | Admitting: Physical Therapy

## 2018-07-13 NOTE — Telephone Encounter (Signed)
Copied from [place] [phone]. Topic: General - Other >> Jul 13, 2018  9:09 AM Jodie Echevaria wrote: Reason for CRM: Doree Fudge [facility] called to say that the patient need the referral from his insurance in order for him to be scheduled. She stated that he was turned away several times because of this issue. Please advise

## 2018-07-23 DIAGNOSIS — H5211 Myopia, right eye: Secondary | ICD-10-CM | POA: Diagnosis not present

## 2018-07-23 DIAGNOSIS — E119 Type 2 diabetes mellitus without complications: Secondary | ICD-10-CM | POA: Diagnosis not present

## 2018-07-23 DIAGNOSIS — H52202 Unspecified astigmatism, left eye: Secondary | ICD-10-CM | POA: Diagnosis not present

## 2018-07-23 LAB — HM DIABETES EYE EXAM

## 2018-09-02 ENCOUNTER — Telehealth: Payer: Self-pay | Admitting: Family Medicine

## 2018-09-02 NOTE — Telephone Encounter (Signed)
Patient is calling to schedule CPE Please advise CB- [phone]

## 2018-09-30 ENCOUNTER — Encounter: Payer: Self-pay | Admitting: Family Medicine

## 2018-10-28 ENCOUNTER — Ambulatory Visit (INDEPENDENT_AMBULATORY_CARE_PROVIDER_SITE_OTHER): Payer: BC Managed Care – PPO | Admitting: Family Medicine

## 2018-10-28 ENCOUNTER — Other Ambulatory Visit: Payer: Self-pay

## 2018-10-28 ENCOUNTER — Encounter: Payer: Self-pay | Admitting: Family Medicine

## 2018-10-28 VITALS — BP 120/82 | HR 87 | Temp 97.9°F | Ht 70.0 in | Wt 201.6 lb

## 2018-10-28 DIAGNOSIS — E139 Other specified diabetes mellitus without complications: Secondary | ICD-10-CM

## 2018-10-28 DIAGNOSIS — E1159 Type 2 diabetes mellitus with other circulatory complications: Secondary | ICD-10-CM | POA: Diagnosis not present

## 2018-10-28 DIAGNOSIS — E785 Hyperlipidemia, unspecified: Secondary | ICD-10-CM

## 2018-10-28 DIAGNOSIS — Z0000 Encounter for general adult medical examination without abnormal findings: Secondary | ICD-10-CM

## 2018-10-28 DIAGNOSIS — E1169 Type 2 diabetes mellitus with other specified complication: Secondary | ICD-10-CM

## 2018-10-28 DIAGNOSIS — Z23 Encounter for immunization: Secondary | ICD-10-CM | POA: Diagnosis not present

## 2018-10-28 DIAGNOSIS — I10 Essential (primary) hypertension: Secondary | ICD-10-CM

## 2018-10-28 LAB — POCT GLYCOSYLATED HEMOGLOBIN (HGB A1C): Hemoglobin A1C: 9.3 % — AB (ref 4.0–5.6)

## 2018-10-28 MED ORDER — ROSUVASTATIN CALCIUM 5 MG PO TABS: 5.0000 mg | ORAL_TABLET | Freq: Every day | ORAL | 3 refills | Status: DC

## 2018-10-28 NOTE — Addendum Note (Signed)
Addended by: Marin Olp on: 10/28/2018 03:53 PM   Modules accepted: Orders

## 2018-10-28 NOTE — Progress Notes (Addendum)
Phone: [phone]    Subjective:  Patient presents today for their annual physical. Chief complaint-noted.   See problem oriented charting- ROS- full  review of systems was completed and negative  except for: fatigue, eye itching. Later in nursing notes- noted intermittent chest pain- see mychart message sent 11/03/2018.   The following were reviewed and entered/updated in epic: History reviewed. No pertinent past medical history. Patient Active Problem List   Diagnosis Date Noted  . Diabetes mellitus type 1.5 (HCC) 05/14/2018    Priority: High  . Hypertension associated with diabetes (HCC) 05/14/2018  . Hyperlipidemia associated with type 2 diabetes mellitus (HCC) 05/14/2018  . Low back pain 05/14/2018   Past Surgical History:  Procedure Laterality Date  . BACK SURGERY     x2  . EYE SURGERY     as child- strabismus. Sees Dr. Charlotte SanesMccuen  . TONSILLECTOMY     in 5120s    Family History  Problem Relation Age of Onset  . Osteoarthritis Mother   . Rheumatic fever Father        both valves replaced at [facility]  . Hypertension Father   . Hyperlipidemia Father   . Stroke Father        embolus  . Healthy Brother   . Gastric cancer Paternal Grandfather   . Colon cancer Maternal Uncle        stage IV    Medications- reviewed and updated Current Outpatient Medications  Medication Sig Dispense Refill  . celecoxib (CELEBREX) 100 MG capsule Take 100 mg by mouth 2 (two) times daily.     . Insulin Human (INSULIN PUMP) SOLN Inject 1 each into the skin continuous.    Marland Kitchen. lisinopril (PRINIVIL,ZESTRIL) 10 MG tablet Take 20 mg by mouth daily.    . metFORMIN (GLUCOPHAGE-XR) 500 MG 24 hr tablet Take 500 mg by mouth daily.    . methocarbamol (ROBAXIN) 500 MG tablet Take 500 mg by mouth every 6 (six) hours as needed for muscle spasms.     . rosuvastatin (CRESTOR) 5 MG tablet Take 1 tablet (5 mg total) by mouth daily. 90 tablet 3   No current facility-administered medications for this  visit.     Allergies-reviewed and updated Allergies  Allergen Reactions  . Prednisone Other (See Comments)    Elevated blood pressure    Social History   Social History Narrative   Married. 292 kids 43 year old and 43 year old in 2020.       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 Oropharynx normal Neck: no thyromegaly CV: RRR no murmurs rubs or gallops Lungs: CTAB no crackles, wheeze, rhonchi Abdomen: soft/nontender/nondistended/normal bowel sounds. No rebound or guarding.  Ext: no edema Skin: warm, dry Neuro: grossly normal, moves all extremities, PERRLA Diabetic Foot Exam - Simple   Simple Foot Form Diabetic Foot exam was performed with the following findings: Yes 10/28/2018  3:19 PM  Visual Inspection No  deformities, no ulcerations, no other skin breakdown bilaterally: Yes Sensation Testing Intact to touch and monofilament testing bilaterally: Yes Pulse Check Posterior Tibialis and Dorsalis pulse intact bilaterally: Yes Comments       Assessment and Plan:  43 y.o. male presenting for annual physical.  Health Maintenance counseling: 1. Anticipatory guidance: Patient counseled regarding regular dental exams -q4 months in general, eye exams - was in may- we are trying to get records,  avoiding smoking and second hand smoke , limiting alcohol to 2 beverages per day- 2 or less per week .   2. Risk factor reduction:  Advised patient of need for regular exercise and diet rich and fruits and vegetables to reduce  risk of heart attack and stroke. Exercise- not exercising regularly right now- trying to find rhythm in his new role/with travel. Diet-  His vice is diet soft drinks. Fluctuates between 190-204. Off his dexcom which is difficult for him- he is hoping insurance issues will allow him to get back on this- wants to get in 180s long term- one of bigger ways to push this lower for him has been exercise in psat.  Wt Readings from Last 3 Encounters:  10/28/18 201 lb 9.6 oz (91.4 kg)  05/14/18 200 lb (90.7 kg)  04/14/14 190 lb (86.2 kg)  3. Immunizations/screenings/ancillary studies- flu shot today. Already screened for HIV as has donated blood in last 10 years  Immunization History  Administered Date(s) Administered  . Influenza Split 11/03/2017  . Influenza,inj,Quad PF,6+ Mos 10/28/2018  . Pneumococcal Polysaccharide-23 12/10/2011  . Tdap 12/05/2011  4. Prostate cancer screening-   no family history, start at age 85 (other than grandparent in 24s- made it to 33) 109. Colon cancer screening - Consider checking with insurance at 45 to see if they will cover colonoscopy/cologuard per ACS guidelines.  6. Skin cancer screening/prevention- no dermatologist. advised regular sunscreen use. Denies worrisome, changing, or new skin lesions- keeps an eye on spot near waist 7. Testicular cancer screening- advised monthly self exams  8. STD screening- patient opts out as monogomous 9. Never smoker-   Status of chronic or acute concerns   Diabetes: Followed by [facility] endocrinology controlled on Insulin pump and Metformin 500Mg . Lab Results  Component Value Date   HGBA1C 9.3 (A) 10/28/2018  felt like first 1.5 months of this was doing really well with dexcom- 2nd half was without dexcom and #s have trended up.  Pt states his sugars have been high due to switching insurances and not having his proper diabetic needs. He is not exercising like he should. His diet is "ok", he is watching his carbs. -a1c slightly down  today- he has follow up in October with [facility] to make adjustments.  - next month or two hoping to get dexcom back   Hyperlipidemia: Rosuvastatin 5mg  compliant in past- has run out- will refill then do labs in 6 weeks.    Hypertension-controlled on lisinopril 10mg .   Recommended follow up: 6 month follow up   Lab/Order associations: will come back fasting   ICD-10-CM   1. Preventative health care  Z00.00 CBC    Comprehensive metabolic panel    Lipid panel  2. Diabetes mellitus type 1.5 (HCC)  E13.9 POCT HgB A1C    CBC    Comprehensive metabolic panel    Lipid panel  3. Hypertension associated with diabetes ([place])  E11.59 CBC   I10 Comprehensive metabolic panel    Lipid panel  4. Hyperlipidemia associated with type 2 diabetes  mellitus (HCC)  E11.69 CBC   E78.5 Comprehensive metabolic panel    Lipid panel  5. Need for influenza vaccination  Z23 Flu Vaccine QUAD 6+ mos PF IM (Fluarix Quad PF)    Meds ordered this encounter  Medications  . rosuvastatin (CRESTOR) 5 MG tablet    Sig: Take 1 tablet (5 mg total) by mouth daily.    Dispense:  90 tablet    Refill:  3    Return precautions advised.   Tana Conch, MD

## 2018-10-28 NOTE — Patient Instructions (Addendum)
Health Maintenance Due  Topic Date Due  . OPHTHALMOLOGY EXAM -06/2018, [facility]- we are requesting records 11/28/1985  . INFLUENZA VACCINE -today, already given 09/04/2018   Lab Results  Component Value Date   HGBA1C 9.3 (A) 10/28/2018  glad you have endocrine follow up and hope you get your dexcom back soon  Schedule a lab visit at the check out desk in roughly 6 weeks.  Return for future fasting labs meaning nothing but water after midnight please. Ok to take your medications with water.

## 2018-11-03 ENCOUNTER — Encounter: Payer: Self-pay | Admitting: Family Medicine

## 2018-11-05 ENCOUNTER — Encounter: Payer: Self-pay | Admitting: Family Medicine

## 2019-02-02 DIAGNOSIS — Z794 Long term (current) use of insulin: Secondary | ICD-10-CM | POA: Diagnosis not present

## 2019-02-02 DIAGNOSIS — E109 Type 1 diabetes mellitus without complications: Secondary | ICD-10-CM | POA: Diagnosis not present

## 2019-02-17 ENCOUNTER — Encounter: Payer: Self-pay | Admitting: Family Medicine

## 2019-04-25 ENCOUNTER — Encounter: Payer: Self-pay | Admitting: Family Medicine

## 2019-04-25 ENCOUNTER — Other Ambulatory Visit: Payer: Self-pay

## 2019-04-25 ENCOUNTER — Ambulatory Visit: Payer: BC Managed Care – PPO | Admitting: Family Medicine

## 2019-04-25 VITALS — BP 130/90 | HR 84 | Temp 98.2°F | Ht 70.0 in | Wt 208.8 lb

## 2019-04-25 DIAGNOSIS — E1169 Type 2 diabetes mellitus with other specified complication: Secondary | ICD-10-CM

## 2019-04-25 DIAGNOSIS — I10 Essential (primary) hypertension: Secondary | ICD-10-CM

## 2019-04-25 DIAGNOSIS — R0789 Other chest pain: Secondary | ICD-10-CM | POA: Diagnosis not present

## 2019-04-25 DIAGNOSIS — E1159 Type 2 diabetes mellitus with other circulatory complications: Secondary | ICD-10-CM | POA: Diagnosis not present

## 2019-04-25 DIAGNOSIS — E785 Hyperlipidemia, unspecified: Secondary | ICD-10-CM

## 2019-04-25 MED ORDER — LISINOPRIL 10 MG PO TABS: 10.0000 mg | ORAL_TABLET | Freq: Every day | ORAL | 3 refills | Status: DC

## 2019-04-25 NOTE — Progress Notes (Signed)
Phone [phone] In person visit   Subjective:   David Higgins is a 44 y.o. year old very pleasant male patient who presents for/with See problem oriented charting Chief Complaint  Patient presents with  . Follow-up   This visit occurred during the SARS-CoV-2 public health emergency.  Safety protocols were in place, including screening questions prior to the visit, additional usage of staff PPE, and extensive cleaning of exam room while observing appropriate contact time as indicated for disinfecting solutions.   Past Medical History-  Patient Active Problem List   Diagnosis Date Noted  . Diabetes mellitus type 1.5 ([place]) 05/14/2018    Priority: High  . Hypertension associated with diabetes ([place]) 05/14/2018    Priority: Medium  . Hyperlipidemia associated with type 2 diabetes mellitus ([place]) 05/14/2018    Priority: Medium  . Low back pain 05/14/2018    Medications- reviewed and updated Current Outpatient Medications  Medication Sig Dispense Refill  . Insulin Human (INSULIN PUMP) SOLN Inject 1 each into the skin continuous.    Marland Kitchen lisinopril (ZESTRIL) 10 MG tablet Take 1 tablet (10 mg total) by mouth daily. 90 tablet 3  . methocarbamol (ROBAXIN) 500 MG tablet Take 500 mg by mouth every 6 (six) hours as needed for muscle spasms.     . rosuvastatin (CRESTOR) 5 MG tablet Take 1 tablet (5 mg total) by mouth daily. 90 tablet 3   No current facility-administered medications for this visit.     Objective:  BP 130/90   Pulse 84   Temp 98.2 F (36.8 C)   Ht 5\' 10"  (1.778 m)   Wt 208 lb 12.8 oz (94.7 kg)   SpO2 96%   BMI 29.96 kg/m  Gen: NAD, resting comfortably CV: RRR no murmurs rubs or gallops Lungs: CTAB no crackles, wheeze, rhonchi Ext: no edema Skin: warm, dry  EKG: sinus rhythm with rate 80, normal axis, normal intervals, no hypertrophy, no st or t wave changes. Poor lead contact v6     Assessment and Plan   # Chest discomfort S:no exertional component. Sometimes  related to food- feels slightly better lately . Does not seem positional. Central chest without radiation (other than perhaps mild pain in back). No shortness of breath or left arm or neck pain. Lasts for a few seconds or a few minutes at max. 1-2/10 pain. Previously we had mentioned possibly using pepcid, tums etc but usually by the time he would get up to get those- it is gone.   Can do treadmill, bike without issues.   A/P: EKG reassuring. May be GERD related- encouraged trial pepcid/tumrs. No exertional component. Unlikely cardiac- if he has new or worsening symptoms or any exertional component he will let us know - would consider cardiology referral   #hypertension S: compliant with lisinopril 10mg . Does not check at home regularly- occasionally checks and gets 120s/30s over 80s. Exercising 1-2 days a week- weight up 7 lbs from September.  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  BP Readings from Last 3 Encounters:  04/25/19 130/90  10/28/18 120/82  04/14/14 129/77  A/P: slightly elevated today. We opted to focus on healthy eating/regular exercise/weight loss instead of increasing medicine- if remains high at follow up can make adjustment. I also feel pretty good because home #s have been better than they have been in office and I encouraged him to check at least once a week and if sees  this trending up to let us know- for increasing medicines would beat or above 140/90 on average but ideally blood pressure will be <130/80 - did take naprosyn earlier today x2 which may have raised his #s slightly  # Diabetes S: Compliant with insulin pump and metformin through [facility] endocrinology Lab Results  Component Value Date   HGBA1C 9.3 (A) 10/28/2018   HGBA1C 9.8 04/13/2018   HGBA1C 9.8 04/13/2018   A/P: he is due for follow up next month with [facility]- asked him if possible to send me his next a1c  #hyperlipidemia S: compliant  with rosuvastatin 5 mg  Daily- missed a week two weeks ago A/P: hopefully controlled- update lipid panel today  Recommended follow up: Return in about 6 months (around 10/26/2019) for physical or sooner if needed.  Lab/Order associations:   ICD-10-CM   1. Atypical chest pain  R07.89 EKG 12-Lead  2. Hypertension associated with diabetes (HCC) Chronic E11.59    I10   3. Hyperlipidemia associated with type 2 diabetes mellitus (HCC)  E11.69    E78.5     Meds ordered this encounter  Medications  . lisinopril (ZESTRIL) 10 MG tablet    Sig: Take 1 tablet (10 mg total) by mouth daily.    Dispense:  90 tablet    Refill:  3    Return precautions advised.  Tana Conch, MD

## 2019-04-25 NOTE — Patient Instructions (Addendum)
Blood pressure slightly elevated today. We opted to focus on healthy eating (dash eating plan)/regular exercise/weight loss instead of increasing medicine- if remains high at follow up can make adjustment. I also feel pretty good because home #s have been better than they have been in office and I encouraged him to check at least once a week and if sees this trending up to let us know- for increasing medicines would beat or above 140/90 on average but ideally blood pressure will be <130/80  he is due for follow up next month with [facility]- asked him if possible to send me his next a1c  Schedule physical 1 year and 1 day after last physical  EKG reassuring. May be GERD related- encouraged trial pepcid/tumrs. No exertional component. Unlikely cardiac- if he has new or worsening symptoms or any exertional component he will let us know - would consider cardiology referral   Please stop by lab before you go If you do not have mychart- we will call you about results within 5 business days of [place] receiving them.  If you have mychart- we will send your results within 3 business days of [place] receiving them.  If abnormal or we want to clarify a result, we will call or mychart you to make sure you receive the message.  If you have questions or concerns or don't hear within 5 business days, please send [place] a message or call us.    DASH Eating Plan DASH stands for "Dietary Approaches to Stop Hypertension." The DASH eating plan is a healthy eating plan that has been shown to reduce high blood pressure (hypertension). It may also reduce your risk for type 2 diabetes, heart disease, and stroke. The DASH eating plan may also help with weight loss. What are tips for following this plan?  General guidelines  Avoid eating more than 2,300 mg (milligrams) of salt (sodium) a day. If you have hypertension, you may need to reduce your sodium intake to 1,500 mg a day.  Limit alcohol intake to no more than 1 drink a day for  nonpregnant women and 2 drinks a day for men. One drink equals 12 oz of beer, 5 oz of wine, or 1 oz of hard liquor.  Work with your health care provider to maintain a healthy body weight or to lose weight. Ask what an ideal weight is for you.  Get at least 30 minutes of exercise that causes your heart to beat faster (aerobic exercise) most days of the week. Activities may include walking, swimming, or biking.  Work with your health care provider or diet and nutrition specialist (dietitian) to adjust your eating plan to your individual calorie needs. Reading food labels   Check food labels for the amount of sodium per serving. Choose foods with less than 5 percent of the Daily Value of sodium. Generally, foods with less than 300 mg of sodium per serving fit into this eating plan.  To find whole grains, look for the word "whole" as the first word in the ingredient list. Shopping  Buy products labeled as "low-sodium" or "no salt added."  Buy fresh foods. Avoid canned foods and premade or frozen meals. Cooking  Avoid adding salt when cooking. Use salt-free seasonings or herbs instead of table salt or sea salt. Check with your health care provider or pharmacist before using salt substitutes.  Do not fry foods. Cook foods using healthy methods such as baking, boiling, grilling, and broiling instead.  Cook with heart-healthy oils, such as  olive, canola, soybean, or sunflower oil. Meal planning  Eat a balanced diet that includes: ? 5 or more servings of fruits and vegetables each day. At each meal, try to fill half of your plate with fruits and vegetables. ? Up to 6-8 servings of whole grains each day. ? Less than 6 oz of lean meat, poultry, or fish each day. A 3-oz serving of meat is about the same size as a deck of cards. One egg equals 1 oz. ? 2 servings of low-fat dairy each day. ? A serving of nuts, seeds, or beans 5 times each week. ? Heart-healthy fats. Healthy fats called Omega-3  fatty acids are found in foods such as flaxseeds and coldwater fish, like sardines, salmon, and mackerel.  Limit how much you eat of the following: ? Canned or prepackaged foods. ? Food that is high in trans fat, such as fried foods. ? Food that is high in saturated fat, such as fatty meat. ? Sweets, desserts, sugary drinks, and other foods with added sugar. ? Full-fat dairy products.  Do not salt foods before eating.  Try to eat at least 2 vegetarian meals each week.  Eat more home-cooked food and less restaurant, buffet, and fast food.  When eating at a restaurant, ask that your food be prepared with less salt or no salt, if possible. What foods are recommended? The items listed may not be a complete list. Talk with your dietitian about what dietary choices are best for you. Grains Whole-grain or whole-wheat bread. Whole-grain or whole-wheat pasta. Brown rice. Orpah Cobb. Bulgur. Whole-grain and low-sodium cereals. Pita bread. Low-fat, low-sodium crackers. Whole-wheat flour tortillas. Vegetables Fresh or frozen vegetables (raw, steamed, roasted, or grilled). Low-sodium or reduced-sodium tomato and vegetable juice. Low-sodium or reduced-sodium tomato sauce and tomato paste. Low-sodium or reduced-sodium canned vegetables. Fruits All fresh, dried, or frozen fruit. Canned fruit in natural juice (without added sugar). Meat and other protein foods Skinless chicken or [place]. Ground chicken or [place]. Pork with fat trimmed off. Fish and seafood. Egg whites. Dried beans, peas, or lentils. Unsalted nuts, nut butters, and seeds. Unsalted canned beans. Lean cuts of beef with fat trimmed off. Low-sodium, lean deli meat. Dairy Low-fat (1%) or fat-free (skim) milk. Fat-free, low-fat, or reduced-fat cheeses. Nonfat, low-sodium ricotta or cottage cheese. Low-fat or nonfat yogurt. Low-fat, low-sodium cheese. Fats and oils Soft margarine without trans fats. Vegetable oil. Low-fat, reduced-fat, or  light mayonnaise and salad dressings (reduced-sodium). Canola, safflower, olive, soybean, and sunflower oils. Avocado. Seasoning and other foods Herbs. Spices. Seasoning mixes without salt. Unsalted popcorn and pretzels. Fat-free sweets. What foods are not recommended? The items listed may not be a complete list. Talk with your dietitian about what dietary choices are best for you. Grains Baked goods made with fat, such as croissants, muffins, or some breads. Dry pasta or rice meal packs. Vegetables Creamed or fried vegetables. Vegetables in a cheese sauce. Regular canned vegetables (not low-sodium or reduced-sodium). Regular canned tomato sauce and paste (not low-sodium or reduced-sodium). Regular tomato and vegetable juice (not low-sodium or reduced-sodium). Rosita Fire. Olives. Fruits Canned fruit in a light or heavy syrup. Fried fruit. Fruit in cream or butter sauce. Meat and other protein foods Fatty cuts of meat. Ribs. Fried meat. Tomasa Blase. Sausage. Bologna and other processed lunch meats. Salami. Fatback. Hotdogs. Bratwurst. Salted nuts and seeds. Canned beans with added salt. Canned or smoked fish. Whole eggs or egg yolks. Chicken or [place] with skin. Dairy Whole or 2% milk, cream, and half-and-half.  Whole or full-fat cream cheese. Whole-fat or sweetened yogurt. Full-fat cheese. Nondairy creamers. Whipped toppings. Processed cheese and cheese spreads. Fats and oils Butter. Stick margarine. Lard. Shortening. Ghee. Bacon fat. Tropical oils, such as coconut, palm kernel, or palm oil. Seasoning and other foods Salted popcorn and pretzels. Onion salt, garlic salt, seasoned salt, table salt, and sea salt. Worcestershire sauce. Tartar sauce. Barbecue sauce. Teriyaki sauce. Soy sauce, including reduced-sodium. Steak sauce. Canned and packaged gravies. Fish sauce. Oyster sauce. Cocktail sauce. Horseradish that you find on the shelf. Ketchup. Mustard. Meat flavorings and tenderizers. Bouillon cubes. Hot  sauce and Tabasco sauce. Premade or packaged marinades. Premade or packaged taco seasonings. Relishes. Regular salad dressings. Where to find more information:  National Heart, Lung, and [place]: https://wilson-eaton.com/  American Heart Association: www.heart.org Summary  The DASH eating plan is a healthy eating plan that has been shown to reduce high blood pressure (hypertension). It may also reduce your risk for type 2 diabetes, heart disease, and stroke.  With the DASH eating plan, you should limit salt (sodium) intake to 2,300 mg a day. If you have hypertension, you may need to reduce your sodium intake to 1,500 mg a day.  When on the DASH eating plan, aim to eat more fresh fruits and vegetables, whole grains, lean proteins, low-fat dairy, and heart-healthy fats.  Work with your health care provider or diet and nutrition specialist (dietitian) to adjust your eating plan to your individual calorie needs. This information is not intended to replace advice given to you by your health care provider. Make sure you discuss any questions you have with your health care provider. Document Revised: 01/02/2017 Document Reviewed: 01/14/2016 Elsevier Patient Education  2020 [employer].

## 2019-07-25 DIAGNOSIS — E139 Other specified diabetes mellitus without complications: Secondary | ICD-10-CM | POA: Diagnosis not present

## 2019-07-25 DIAGNOSIS — Z9641 Presence of insulin pump (external) (internal): Secondary | ICD-10-CM | POA: Diagnosis not present

## 2019-08-01 DIAGNOSIS — Z794 Long term (current) use of insulin: Secondary | ICD-10-CM | POA: Diagnosis not present

## 2019-08-01 DIAGNOSIS — E109 Type 1 diabetes mellitus without complications: Secondary | ICD-10-CM | POA: Diagnosis not present

## 2019-10-25 DIAGNOSIS — E139 Other specified diabetes mellitus without complications: Secondary | ICD-10-CM | POA: Diagnosis not present

## 2019-10-25 DIAGNOSIS — Z9641 Presence of insulin pump (external) (internal): Secondary | ICD-10-CM | POA: Diagnosis not present

## 2019-10-25 LAB — HEMOGLOBIN A1C: Hemoglobin A1C: 8.6

## 2020-01-17 DIAGNOSIS — E109 Type 1 diabetes mellitus without complications: Secondary | ICD-10-CM | POA: Diagnosis not present

## 2020-01-17 DIAGNOSIS — Z794 Long term (current) use of insulin: Secondary | ICD-10-CM | POA: Diagnosis not present

## 2020-03-20 ENCOUNTER — Other Ambulatory Visit: Payer: Self-pay

## 2020-03-20 MED ORDER — ROSUVASTATIN CALCIUM 5 MG PO TABS: 5.0000 mg | ORAL_TABLET | Freq: Every day | ORAL | 3 refills | Status: DC

## 2020-05-01 DIAGNOSIS — E139 Other specified diabetes mellitus without complications: Secondary | ICD-10-CM | POA: Diagnosis not present

## 2020-05-01 DIAGNOSIS — Z9641 Presence of insulin pump (external) (internal): Secondary | ICD-10-CM | POA: Diagnosis not present

## 2020-05-29 ENCOUNTER — Other Ambulatory Visit: Payer: Self-pay | Admitting: Family Medicine

## 2020-06-12 DIAGNOSIS — Z794 Long term (current) use of insulin: Secondary | ICD-10-CM | POA: Diagnosis not present

## 2020-06-12 DIAGNOSIS — E109 Type 1 diabetes mellitus without complications: Secondary | ICD-10-CM | POA: Diagnosis not present

## 2020-07-12 DIAGNOSIS — Z1211 Encounter for screening for malignant neoplasm of colon: Secondary | ICD-10-CM | POA: Diagnosis not present

## 2020-07-12 DIAGNOSIS — Z23 Encounter for immunization: Secondary | ICD-10-CM | POA: Diagnosis not present

## 2020-07-12 DIAGNOSIS — I471 Supraventricular tachycardia: Secondary | ICD-10-CM | POA: Diagnosis not present

## 2020-07-12 DIAGNOSIS — K2100 Gastro-esophageal reflux disease with esophagitis, without bleeding: Secondary | ICD-10-CM | POA: Diagnosis not present

## 2020-07-12 DIAGNOSIS — D699 Hemorrhagic condition, unspecified: Secondary | ICD-10-CM | POA: Diagnosis not present

## 2021-01-02 DIAGNOSIS — E139 Other specified diabetes mellitus without complications: Secondary | ICD-10-CM | POA: Diagnosis not present

## 2021-01-02 DIAGNOSIS — Z9641 Presence of insulin pump (external) (internal): Secondary | ICD-10-CM | POA: Diagnosis not present

## 2021-01-05 DIAGNOSIS — E109 Type 1 diabetes mellitus without complications: Secondary | ICD-10-CM | POA: Diagnosis not present

## 2021-01-05 DIAGNOSIS — Z794 Long term (current) use of insulin: Secondary | ICD-10-CM | POA: Diagnosis not present

## 2021-01-24 ENCOUNTER — Other Ambulatory Visit: Payer: Self-pay

## 2021-01-24 ENCOUNTER — Ambulatory Visit (INDEPENDENT_AMBULATORY_CARE_PROVIDER_SITE_OTHER): Payer: BC Managed Care – PPO | Admitting: Family Medicine

## 2021-01-24 ENCOUNTER — Encounter: Payer: Self-pay | Admitting: Family Medicine

## 2021-01-24 VITALS — BP 124/62 | HR 87 | Temp 98.3°F | Ht 70.0 in | Wt 210.8 lb

## 2021-01-24 DIAGNOSIS — E1169 Type 2 diabetes mellitus with other specified complication: Secondary | ICD-10-CM | POA: Diagnosis not present

## 2021-01-24 DIAGNOSIS — Z1159 Encounter for screening for other viral diseases: Secondary | ICD-10-CM | POA: Diagnosis not present

## 2021-01-24 DIAGNOSIS — E139 Other specified diabetes mellitus without complications: Secondary | ICD-10-CM

## 2021-01-24 DIAGNOSIS — E1369 Other specified diabetes mellitus with other specified complication: Secondary | ICD-10-CM

## 2021-01-24 DIAGNOSIS — Z0000 Encounter for general adult medical examination without abnormal findings: Secondary | ICD-10-CM | POA: Diagnosis not present

## 2021-01-24 DIAGNOSIS — E1159 Type 2 diabetes mellitus with other circulatory complications: Secondary | ICD-10-CM

## 2021-01-24 DIAGNOSIS — I152 Hypertension secondary to endocrine disorders: Secondary | ICD-10-CM

## 2021-01-24 DIAGNOSIS — Z1211 Encounter for screening for malignant neoplasm of colon: Secondary | ICD-10-CM

## 2021-01-24 DIAGNOSIS — Z23 Encounter for immunization: Secondary | ICD-10-CM | POA: Diagnosis not present

## 2021-01-24 DIAGNOSIS — E785 Hyperlipidemia, unspecified: Secondary | ICD-10-CM

## 2021-01-24 LAB — CBC WITH DIFFERENTIAL/PLATELET
Basophils Absolute: 0.1 10*3/uL (ref 0.0–0.1)
Basophils Relative: 0.9 % (ref 0.0–3.0)
Eosinophils Absolute: 0.1 10*3/uL (ref 0.0–0.7)
Eosinophils Relative: 1.3 % (ref 0.0–5.0)
HCT: 42.8 % (ref 39.0–52.0)
Hemoglobin: 14.4 g/dL (ref 13.0–17.0)
Lymphocytes Relative: 30.3 % (ref 12.0–46.0)
Lymphs Abs: 1.8 10*3/uL (ref 0.7–4.0)
MCHC: 33.6 g/dL (ref 30.0–36.0)
MCV: 84.9 fl (ref 78.0–100.0)
Monocytes Absolute: 0.4 10*3/uL (ref 0.1–1.0)
Monocytes Relative: 7.3 % (ref 3.0–12.0)
Neutro Abs: 3.6 10*3/uL (ref 1.4–7.7)
Neutrophils Relative %: 60.2 % (ref 43.0–77.0)
Platelets: 311 10*3/uL (ref 150.0–400.0)
RBC: 5.04 Mil/uL (ref 4.22–5.81)
RDW: 13.2 % (ref 11.5–15.5)
WBC: 5.9 10*3/uL (ref 4.0–10.5)

## 2021-01-24 LAB — COMPREHENSIVE METABOLIC PANEL
ALT: 27 U/L (ref 0–53)
AST: 23 U/L (ref 0–37)
Albumin: 4.4 g/dL (ref 3.5–5.2)
Alkaline Phosphatase: 62 U/L (ref 39–117)
BUN: 14 mg/dL (ref 6–23)
CO2: 27 mEq/L (ref 19–32)
Calcium: 9.5 mg/dL (ref 8.4–10.5)
Chloride: 101 mEq/L (ref 96–112)
Creatinine, Ser: 1.05 mg/dL (ref 0.40–1.50)
GFR: 85.94 mL/min (ref >60.00)
Glucose, Bld: 216 mg/dL — ABNORMAL HIGH (ref 70–99)
Potassium: 4.5 mEq/L (ref 3.5–5.1)
Sodium: 135 mEq/L (ref 135–145)
Total Bilirubin: 0.4 mg/dL (ref 0.2–1.2)
Total Protein: 7.2 g/dL (ref 6.0–8.3)

## 2021-01-24 LAB — LDL CHOLESTEROL, DIRECT: Direct LDL: 131 mg/dL

## 2021-01-24 LAB — LIPID PANEL
Cholesterol: 174 mg/dL (ref 0–200)
HDL: 35.1 mg/dL — ABNORMAL LOW (ref >39.00)
NonHDL: 138.78
Total CHOL/HDL Ratio: 5
Triglycerides: 219 mg/dL — ABNORMAL HIGH (ref 0.0–149.0)
VLDL: 43.8 mg/dL — ABNORMAL HIGH (ref 0.0–40.0)

## 2021-01-24 MED ORDER — LISINOPRIL 10 MG PO TABS: ORAL_TABLET | ORAL | 3 refills | Status: DC

## 2021-01-24 MED ORDER — ROSUVASTATIN CALCIUM 5 MG PO TABS: 5.0000 mg | ORAL_TABLET | Freq: Every day | ORAL | 3 refills | Status: DC

## 2021-01-24 NOTE — Patient Instructions (Addendum)
Health Maintenance Due  Topic Date Due   Pneumococcal Vaccine 45-45 Years old (2 - PCV) - Patient plans to get this in the future.  12/09/2012   COVID-19 Vaccine (3 - Booster for [employer] series) -Recommend getting Omicron/Bivalent booster your local pharmacy - you may wait a little until you are fully recovered after getting your flu shot today. Please let us know when you have received this vaccination.  05/10/2019   OPHTHALMOLOGY EXAM  - the next time you see your ophthalmologist please have him send [place] over a copy of your latest eye exam records.  07/23/2019   INFLUENZA VACCINE  - Thanks for getting you flu shot today!  09/03/2020   COLONOSCOPY  - Hold off for now - we will refer once you get your new insurance in place.  [facility] contact Address: [address] [place], [place], [place] [address] Phone: [phone]   Never done   Please stop by lab before you go If you have mychart- we will send your results within 3 business days of [place] receiving them.  If you do not have mychart- we will call you about results within 5 business days of [place] receiving them.  *please also note that you will see labs on mychart as soon as they post. I will later go in and write notes on them- will say "notes from Dr. Durene Cal"  Goal is to get at least 150 minutes of regular exercise daily.  [email or web address] if you want to join [place]!  Recommended follow up: Return in about 6 months (around 07/25/2021) for a follow-up or sooner if needed.

## 2021-01-24 NOTE — Progress Notes (Signed)
Phone: [phone]    Subjective:  Patient presents today for their annual physical. Chief complaint-noted.   See problem oriented charting- ROS- full  review of systems was completed and negative per full ROS sheet  The following were reviewed and entered/updated in epic: History reviewed. No pertinent past medical history. Patient Active Problem List   Diagnosis Date Noted   Diabetes mellitus type 1.5 (HCC) 05/14/2018    Priority: High   Hypertension associated with diabetes (HCC) 05/14/2018    Priority: Medium    Hyperlipidemia associated with type 2 diabetes mellitus (HCC) 05/14/2018    Priority: Medium    Low back pain 05/14/2018   Past Surgical History:  Procedure Laterality Date   BACK SURGERY     x2   EYE SURGERY     as child- strabismus. Sees Dr. Charlotte Sanes   TONSILLECTOMY     in 27s    Family History  Problem Relation Age of Onset   Osteoarthritis Mother    Rheumatic fever Father        both valves replaced at [facility]   Hypertension Father    Hyperlipidemia Father    Stroke Father        embolus   Healthy Brother    Prostate cancer Maternal Grandfather        in 64s   Gastric cancer Paternal Grandfather    Colon cancer Maternal Uncle        stage IV    Medications- reviewed and updated Current Outpatient Medications  Medication Sig Dispense Refill   Insulin Human (INSULIN PUMP) SOLN Inject 1 each into the skin continuous.     methocarbamol (ROBAXIN) 500 MG tablet Take 500 mg by mouth every 6 (six) hours as needed for muscle spasms.      lisinopril (ZESTRIL) 10 MG tablet TAKE 1 TABLET(10 MG) BY MOUTH DAILY 90 tablet 3   rosuvastatin (CRESTOR) 5 MG tablet Take 1 tablet (5 mg total) by mouth daily. 90 tablet 3   No current facility-administered medications for this visit.    Allergies-reviewed and updated Allergies  Allergen Reactions   Prednisone Other (See Comments)    Elevated blood pressure    Social History   Social History  Narrative   Married. 26 kids 42 year old and 25 year old in 2020.       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    - former with [employer] in [employer]      Hobbies: time with kids, hiking, mountain biking      Objective:  BP 124/62 (BP Location: Left Arm)    Pulse 87    Temp 98.3 F (36.8 C) (Temporal)    Ht 5\' 10"  (1.778 m)    Wt 210 lb 12.8 oz (95.6 kg)    SpO2 98%    BMI 30.25 kg/m  Gen: NAD, resting comfortably HEENT: Mucous membranes are moist. Oropharynx normal Neck: no thyromegaly CV: RRR no murmurs rubs or gallops Lungs: CTAB no crackles, wheeze, rhonchi Abdomen: soft/nontender/nondistended/normal bowel sounds. No rebound or guarding.  Ext: no edema Skin: warm, dry Neuro: grossly normal, moves all extremities, PERRLA    Diabetic Foot Exam - Simple   Simple Foot Form Diabetic Foot exam was performed with the following findings: Yes 01/24/2021  1:19 PM  Visual Inspection No deformities, no ulcerations, no other skin breakdown bilaterally: Yes Sensation Testing Intact to touch and monofilament  testing bilaterally: Yes Pulse Check Posterior Tibialis and Dorsalis pulse intact bilaterally: Yes Comments        Assessment and Plan:  45 y.o. male presenting for annual physical.  Health Maintenance counseling: 1. Anticipatory guidance: Patient counseled regarding regular dental exams -q4 months in general- upcoming crown revisal,  eye exams -yearly- needs updated visit- occasional readers, avoiding smoking and second hand smoke , limiting alcohol to 2 beverages per day -2 or less per week, no illicit drugs.   2. Risk factor reduction:  Advised patient of need for regular exercise and diet rich and fruits and vegetables to reduce risk of heart attack and stroke.  Exercise- was not exercising in last physical- has tonal and elliptical at home now- using more.  Diet/weight  management-within 2 lbs of 18-24 months ago.  Lower he got down to was 178 before diabetes diagnosis Wt Readings from Last 3 Encounters:  01/24/21 210 lb 12.8 oz (95.6 kg)  04/25/19 208 lb 12.8 oz (94.7 kg)  10/28/18 201 lb 9.6 oz (91.4 kg)  3. Immunizations/screenings/ancillary studies- discuss Pneumonia vaccine-in future in light of diabetes, Omicron/Bivalent booster-discussed at pharmacy, and Flu shot - today - otherwise immunizations up-to-date. Immunization History  Administered Date(s) Administered   Influenza Split 11/03/2017   Influenza,inj,Quad PF,6+ Mos 10/28/2018, 01/24/2021   PFIZER(Purple Top)SARS-COV-2 Vaccination 02/22/2019, 03/15/2019   Pneumococcal Polysaccharide-23 12/10/2011   Tdap 12/05/2011  4. Prostate cancer screening- no 1st degree family history, start at age 30  5. Colon cancer screening - most insurances are now covering at age 56-we are going refer once he gets new insurance in place 6. Skin cancer screening/prevention-no dermatologist. Advised regular sunscreen use. Denies worrisome, changing, or new skin lesions.  7. Testicular cancer screening- advised monthly self exams  8. STD screening- patient opts out as monogamous  9. Smoking associated screening- never smoker  Status of chronic or acute concerns   # Diabetes- follows with [facility]- seen 2-3 weeks ago S: Medication: Compliant with insulin pump through [facility] -more reactive on metformin so came off Lab Results  Component Value Date   HGBA1C 8.6 10/25/2019   HGBA1C 9.3 (A) 10/28/2018   HGBA1C 9.8 04/13/2018   HGBA1C 9.8 04/13/2018  A/P: Mild poor control last check-continue to work with [facility] as overall trend appears improved from when I was first seeing patient-we also reinforced healthy eating/regular exercise -We need copy of diabetic eye exam  #hyperlipidemia S: Medication: rosuvastatin 5 mg daily in the past A/P: Hoping for LDL at least below 100-update  lipids today, under 70 more ideal  #hypertension S: medication: Lisinopril 10 mg daily BP Readings from Last 3 Encounters:  01/24/21 124/62  04/25/19 130/90  10/28/18 120/82  A/P:  Controlled. Continue current medications.    Recommended follow up: 6 months check ins  Lab/Order associations: Not fasting   ICD-10-CM   1. Preventative health care  Z00.00 CBC with Differential/Platelet    Comprehensive metabolic panel    Lipid panel    Hepatitis C antibody    2. Hyperlipidemia associated with type 2 diabetes mellitus (HCC)  E11.69 CBC with Differential/Platelet   E78.5 Comprehensive metabolic panel    Lipid panel    3. Hypertension associated with diabetes (HCC)  E11.59    I15.2     4. Diabetes mellitus type 1.5 (HCC)  E13.9 CBC with Differential/Platelet    Comprehensive metabolic panel    Lipid panel    5. Screen for colon cancer  Z12.11  6. Encounter for hepatitis C screening test for low risk patient  Z11.59 Hepatitis C antibody    7. Need for immunization against influenza  Z23 Flu Vaccine QUAD 34mo+IM (Fluarix, Fluzone & Alfiuria Quad PF)     Meds ordered this encounter  Medications   rosuvastatin (CRESTOR) 5 MG tablet    Sig: Take 1 tablet (5 mg total) by mouth daily.    Dispense:  90 tablet    Refill:  3   lisinopril (ZESTRIL) 10 MG tablet    Sig: TAKE 1 TABLET(10 MG) BY MOUTH DAILY    Dispense:  90 tablet    Refill:  3   I,Harris Phan,acting as a scribe for Tana Conch, MD.,have documented all relevant documentation on the behalf of Tana Conch, MD,as directed by  Tana Conch, MD while in the presence of Tana Conch, MD.  I, Tana Conch, MD, have reviewed all documentation for this visit. The documentation on 01/24/21 for the exam, diagnosis, procedures, and orders are all accurate and complete.  Return precautions advised.   Tana Conch, MD

## 2021-01-25 LAB — HEPATITIS C ANTIBODY
Hepatitis C Ab: NONREACTIVE
SIGNAL TO CUT-OFF: 0.05 (ref <1.00)

## 2021-06-14 LAB — HEMOGLOBIN A1C: Hemoglobin A1C: 8.1

## 2021-07-11 ENCOUNTER — Encounter: Payer: Self-pay | Admitting: Family Medicine

## 2021-07-14 ENCOUNTER — Encounter: Payer: Self-pay | Admitting: Family Medicine

## 2021-10-28 ENCOUNTER — Encounter: Payer: Self-pay | Admitting: *Deleted

## 2022-01-16 ENCOUNTER — Encounter: Payer: Self-pay | Admitting: *Deleted

## 2022-03-03 ENCOUNTER — Encounter: Payer: Self-pay | Admitting: Family Medicine

## 2022-03-04 ENCOUNTER — Other Ambulatory Visit: Payer: Self-pay

## 2022-03-04 DIAGNOSIS — Z1211 Encounter for screening for malignant neoplasm of colon: Secondary | ICD-10-CM

## 2022-04-02 ENCOUNTER — Other Ambulatory Visit: Payer: Self-pay | Admitting: Family Medicine

## 2022-04-25 DIAGNOSIS — E139 Other specified diabetes mellitus without complications: Secondary | ICD-10-CM | POA: Diagnosis not present

## 2022-04-25 LAB — HEMOGLOBIN A1C: Hemoglobin A1C: 8.4

## 2022-05-28 LAB — HM DIABETES EYE EXAM

## 2022-06-27 ENCOUNTER — Encounter: Payer: Self-pay | Admitting: Family Medicine

## 2022-06-27 ENCOUNTER — Ambulatory Visit (INDEPENDENT_AMBULATORY_CARE_PROVIDER_SITE_OTHER): Payer: 59 | Admitting: Family Medicine

## 2022-06-27 VITALS — BP 128/78 | HR 77 | Temp 98.1°F | Ht 70.0 in | Wt 207.2 lb

## 2022-06-27 DIAGNOSIS — Z1211 Encounter for screening for malignant neoplasm of colon: Secondary | ICD-10-CM

## 2022-06-27 DIAGNOSIS — I10 Essential (primary) hypertension: Secondary | ICD-10-CM | POA: Diagnosis not present

## 2022-06-27 DIAGNOSIS — R0683 Snoring: Secondary | ICD-10-CM

## 2022-06-27 DIAGNOSIS — L989 Disorder of the skin and subcutaneous tissue, unspecified: Secondary | ICD-10-CM

## 2022-06-27 DIAGNOSIS — Z23 Encounter for immunization: Secondary | ICD-10-CM | POA: Diagnosis not present

## 2022-06-27 DIAGNOSIS — E1169 Type 2 diabetes mellitus with other specified complication: Secondary | ICD-10-CM | POA: Diagnosis not present

## 2022-06-27 DIAGNOSIS — Z0000 Encounter for general adult medical examination without abnormal findings: Secondary | ICD-10-CM

## 2022-06-27 DIAGNOSIS — Z794 Long term (current) use of insulin: Secondary | ICD-10-CM

## 2022-06-27 DIAGNOSIS — E785 Hyperlipidemia, unspecified: Secondary | ICD-10-CM | POA: Diagnosis not present

## 2022-06-27 DIAGNOSIS — E139 Other specified diabetes mellitus without complications: Secondary | ICD-10-CM

## 2022-06-27 MED ORDER — ROSUVASTATIN CALCIUM 5 MG PO TABS: ORAL_TABLET | ORAL | 3 refills | Status: DC

## 2022-06-27 MED ORDER — LISINOPRIL 10 MG PO TABS: ORAL_TABLET | ORAL | 3 refills | Status: DC

## 2022-06-27 NOTE — Progress Notes (Signed)
Phone: [phone]    Subjective:  Patient presents today for their annual physical. Chief complaint-noted.   See problem oriented charting- ROS- full  review of systems was completed and negative  except for: snoring, stress  The following were reviewed and entered/updated in epic: History reviewed. No pertinent past medical history. Patient Active Problem List   Diagnosis Date Noted   LADA (latent autoimmune diabetes in adults), managed as type 1 (HCC) 05/14/2018    Priority: High   Essential hypertension 05/14/2018    Priority: Medium    Hyperlipidemia associated with type 2 diabetes mellitus (HCC) 05/14/2018    Priority: Medium    Low back pain 05/14/2018   Past Surgical History:  Procedure Laterality Date   BACK SURGERY     x2   EYE SURGERY     as child- strabismus. Sees Dr. Charlotte Sanes   TONSILLECTOMY     in 29s    Family History  Problem Relation Age of Onset   Osteoarthritis Mother    Rheumatic fever Father        both valves replaced at [facility]   Hypertension Father    Hyperlipidemia Father    Stroke Father        embolus   Healthy Brother    Prostate cancer Maternal Grandfather        in 65s   Gastric cancer Paternal Grandfather    Colon cancer Maternal Uncle        stage IV    Medications- reviewed and updated Current Outpatient Medications  Medication Sig Dispense Refill   Insulin Human (INSULIN PUMP) SOLN Inject 1 each into the skin continuous.     lisinopril (ZESTRIL) 10 MG tablet TAKE 1 TABLET(10 MG) BY MOUTH DAILY 90 tablet 3   metFORMIN (GLUCOPHAGE) 500 MG tablet Take 500 mg by mouth 2 (two) times daily with a meal.     methocarbamol (ROBAXIN) 500 MG tablet Take 500 mg by mouth every 6 (six) hours as needed for muscle spasms.      rosuvastatin (CRESTOR) 5 MG tablet TAKE 1 TABLET(5 MG) BY MOUTH DAILY 90 tablet 3   No current facility-administered medications for this visit.    Allergies-reviewed and updated Allergies  Allergen  Reactions   Prednisone Other (See Comments)    Elevated blood pressure    Social History   Social History Narrative   Married. 37 kids 82 year old and 62 year old in 2020.       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 Oropharynx normal Neck: no thyromegaly CV: RRR no murmurs rubs or gallops Lungs: CTAB no crackles, wheeze, rhonchi Abdomen: soft/nontender/nondistended/normal bowel sounds. No rebound or guarding. Insulin pump in place Ext: no edema Skin: warm, dry, raised firm lesion to right of spine at least 1 x 2 cm Neuro: grossly normal, moves all extremities, PERRLA   Diabetic Foot Exam - Simple   Simple Foot Form Diabetic Foot exam was performed with the  following findings: Yes 06/27/2022  2:21 PM  Visual Inspection No deformities, no ulcerations, no other skin breakdown bilaterally: Yes Sensation Testing Intact to touch and monofilament testing bilaterally: Yes Pulse Check Posterior Tibialis and Dorsalis pulse intact bilaterally: Yes Comments Other than near left MTP small blister that came from recent soccer match referring- heals up         Assessment and Plan:  47 y.o. male presenting for annual physical.  Health Maintenance counseling: 1. Anticipatory guidance: Patient counseled regarding regular dental exams -q4-6 months, eye exams - yearly-date 05/28/22 ,  avoiding smoking and second hand smoke , limiting alcohol to 2 beverages per day- 3 beers a month, no illicit drugs.   2. Risk factor reduction:  Advised patient of need for regular exercise and diet rich and  fruits and vegetables to reduce risk of heart attack and stroke.  Exercise- struggling caring for wife and being there for kids in current stage of life with her illness/cancer.  Diet/weight management-weight down 3 pounds from last visit- could be stress related- feels could cut down on biscuits.  Wt Readings from Last 3 Encounters:  06/27/22 207 lb 3.2 oz (94 kg)  01/24/21 210 lb 12.8 oz (95.6 kg)  04/25/19 208 lb 12.8 oz (94.7 kg)  3. Immunizations/screenings/ancillary studies-declines COVID-19 vaccination- had covd in February- would wait until fall and new vaccine around time of flu shot, recommended updating Tetanus, Diphtheria, and Pertussis (Tdap)- got today, also offered Prevnar 20 due to diabetes- consider next year  Immunization History  Administered Date(s) Administered   Influenza Split 11/03/2017   Influenza,inj,Quad PF,6+ Mos 10/28/2018, 01/24/2021   PFIZER(Purple Top)SARS-COV-2 Vaccination 02/22/2019, 03/15/2019   Pneumococcal Polysaccharide-23 12/10/2011   Tdap 12/05/2011, 06/27/2022  4. Prostate cancer screening-  no family history, start at age 9   5. Colon cancer screening - refer to gastroenterology for first colonoscopy 6. Skin cancer screening/prevention- no dermatologist. advised regular sunscreen use. Denies worrisome, changing, or new skin lesions.  7. Testicular cancer screening- advised monthly self exams  8. STD screening- patient opts out as monogamous 9. Smoking associated screening- never smoker  Status of chronic or acute concerns   #social update- caring for wife with cancer  #occasionally forgets lisinopril, rosuvastatin or metformin. Has been out of rosuvastatin for a week  # Diabetes-latent autoimmune diabetes of adulthood-managed with [facility] S: Medication:Metformin 1000 mg twice daily (less if heavily active), insulin pump tandem CIQ Lab Results  Component Value Date   HGBA1C 8.4 04/25/2022   HGBA1C 8.1 06/14/2021   HGBA1C 8.6  10/25/2019  A/P: has been poorly controlled- working with [facility] on adjustment to get a1c down    #hypertension S: medication: Lisinopril 10 mg BP Readings from Last 3 Encounters:  06/27/22 128/78  01/24/21 124/62  04/25/19 130/90  A/P: stable- continue current medicines    #hyperlipidemia S: Medication:Rosuvastatin 5 mg daily -off for a week A/P: will come back in 2 weeks for labs and restart the rosuvastatin    # Snoring S: Wife reports a lots of snoring. Wakes up feeling like he hasn't rested well A/P: will refer to pulmonary for their opinion and obstructive sleep apnea testing   #Subcutaneous lesion - noted on right thoracic back- appears to be cyst but would liek to be evaulated further by dermatology   Recommended follow up: Return in about 1 year (around 06/27/2023) for physical or sooner if needed.Schedule b4 you leave.  Lab/Order associations:NOT fasting   ICD-10-CM   1. Preventative health care  Z00.00     2. Need for Tdap vaccination  Z23 Tdap vaccine greater than or equal to 7yo IM    3. LADA (latent autoimmune diabetes in adults), managed as type 1 (HCC)  E13.9     4. Hyperlipidemia associated with type 2 diabetes mellitus (HCC)  E11.69    E78.5     5. Essential hypertension  I10     6. Snoring  R06.83     7. Encounter for long-term (current) use of insulin (HCC)  Z79.4      No orders of the defined types were placed in this encounter.  Return precautions advised.  Tana Conch, MD

## 2022-06-27 NOTE — Patient Instructions (Addendum)
[facility] contact Please call to schedule visit and/or procedure Address: [address] [place], [place], [place] [address] Phone: [phone]   We have placed a referral for you today to pulmonology for sleep apnea/snoring testing. In some cases you will see # listed below- you can call this if you have not heard within a week. If you do not see # listed- you should receive a mychart message or phone call within a week with the # to call. Reach out to [place] if you ar enot scheduled within 2 weeks -also separate referral to [facility] dermatology for likely cyst on back  Schedule a lab visit at the check out desk in about  2 weeks. Return for future fasting labs meaning nothing but water after midnight please. Ok to take your medications with water.   Recommended follow up: Return in about 1 year (around 06/27/2023) for physical or sooner if needed.Schedule b4 you leave.

## 2022-07-04 DIAGNOSIS — Z00129 Encounter for routine child health examination without abnormal findings: Secondary | ICD-10-CM | POA: Diagnosis not present

## 2022-07-04 DIAGNOSIS — Z23 Encounter for immunization: Secondary | ICD-10-CM | POA: Diagnosis not present

## 2022-07-23 ENCOUNTER — Other Ambulatory Visit: Payer: 59

## 2022-10-31 DIAGNOSIS — E139 Other specified diabetes mellitus without complications: Secondary | ICD-10-CM | POA: Diagnosis not present

## 2022-10-31 DIAGNOSIS — Z23 Encounter for immunization: Secondary | ICD-10-CM | POA: Diagnosis not present

## 2022-10-31 DIAGNOSIS — Z9641 Presence of insulin pump (external) (internal): Secondary | ICD-10-CM | POA: Diagnosis not present

## 2023-02-20 DIAGNOSIS — E139 Other specified diabetes mellitus without complications: Secondary | ICD-10-CM | POA: Diagnosis not present

## 2023-02-20 DIAGNOSIS — Z9641 Presence of insulin pump (external) (internal): Secondary | ICD-10-CM | POA: Diagnosis not present

## 2023-06-08 DIAGNOSIS — E139 Other specified diabetes mellitus without complications: Secondary | ICD-10-CM | POA: Diagnosis not present

## 2023-06-08 DIAGNOSIS — Z9641 Presence of insulin pump (external) (internal): Secondary | ICD-10-CM | POA: Diagnosis not present

## 2023-09-14 DIAGNOSIS — E139 Other specified diabetes mellitus without complications: Secondary | ICD-10-CM | POA: Diagnosis not present

## 2023-09-14 DIAGNOSIS — Z6830 Body mass index (BMI) 30.0-30.9, adult: Secondary | ICD-10-CM | POA: Diagnosis not present

## 2023-09-14 DIAGNOSIS — E66811 Obesity, class 1: Secondary | ICD-10-CM | POA: Diagnosis not present

## 2023-09-14 DIAGNOSIS — Z9641 Presence of insulin pump (external) (internal): Secondary | ICD-10-CM | POA: Diagnosis not present

## 2023-11-03 ENCOUNTER — Other Ambulatory Visit: Payer: Self-pay | Admitting: Family Medicine

## 2023-11-16 ENCOUNTER — Ambulatory Visit: Admitting: Family Medicine

## 2023-11-16 ENCOUNTER — Ambulatory Visit: Payer: Self-pay

## 2023-11-16 VITALS — BP 120/70 | HR 85 | Temp 98.1°F | Ht 70.0 in | Wt 190.4 lb

## 2023-11-16 DIAGNOSIS — Z7984 Long term (current) use of oral hypoglycemic drugs: Secondary | ICD-10-CM | POA: Diagnosis not present

## 2023-11-16 DIAGNOSIS — L089 Local infection of the skin and subcutaneous tissue, unspecified: Secondary | ICD-10-CM | POA: Diagnosis not present

## 2023-11-16 DIAGNOSIS — E1169 Type 2 diabetes mellitus with other specified complication: Secondary | ICD-10-CM

## 2023-11-16 DIAGNOSIS — I10 Essential (primary) hypertension: Secondary | ICD-10-CM

## 2023-11-16 DIAGNOSIS — E139 Other specified diabetes mellitus without complications: Secondary | ICD-10-CM

## 2023-11-16 DIAGNOSIS — Z1211 Encounter for screening for malignant neoplasm of colon: Secondary | ICD-10-CM

## 2023-11-16 DIAGNOSIS — E785 Hyperlipidemia, unspecified: Secondary | ICD-10-CM

## 2023-11-16 DIAGNOSIS — L723 Sebaceous cyst: Secondary | ICD-10-CM | POA: Diagnosis not present

## 2023-11-16 MED ORDER — ROSUVASTATIN CALCIUM 5 MG PO TABS: 5.0000 mg | ORAL_TABLET | Freq: Every day | ORAL | 3 refills | Status: AC

## 2023-11-16 MED ORDER — DOXYCYCLINE HYCLATE 100 MG PO TABS: 100.0000 mg | ORAL_TABLET | Freq: Two times a day (BID) | ORAL | 0 refills | Status: AC

## 2023-11-16 MED ORDER — LISINOPRIL 10 MG PO TABS: 10.0000 mg | ORAL_TABLET | Freq: Every day | ORAL | 3 refills | Status: AC

## 2023-11-16 NOTE — Patient Instructions (Addendum)
 Health Maintenance Due  Topic Date Due   Colonoscopy  Never done  [facility] contact Please call to schedule visit and/or procedure IF you do not hear within a week Address: [address] [place], [place], [place] [address] Phone: [phone]    You had an infected sebaceous cyst-we are going to cover with doxycycline  especially with your diabetes and the depth of this.  We have also referred to general surgery.  I am hoping you hear by the end of this week from them directly-let us  know if not.  Look out for signs of infection such as worsening redness, or worsening pain purulent drainage significant volume, fevers etc. And let us  know as soon as possible.   Recommended follow up: Return in about 6 months (around 05/16/2024) for physical or sooner if needed.Schedule b4 you leave.

## 2023-11-16 NOTE — Progress Notes (Signed)
 Phone [phone] In person visit   Subjective:   David Higgins is a 48 y.o. year old very pleasant male patient who presents for/with See problem oriented charting Chief Complaint  Patient presents with   Mass    On right side of spine; has been there for awhile but now possibly infected x1 week;     Past Medical History-  Patient Active Problem List   Diagnosis Date Noted   LADA (latent autoimmune diabetes in adults), managed as type 1 (HCC) 05/14/2018    Priority: High   Essential hypertension 05/14/2018    Priority: Medium    Hyperlipidemia associated with type 2 diabetes mellitus (HCC) 05/14/2018    Priority: Medium    Low back pain 05/14/2018    Medications- reviewed and updated Current Outpatient Medications  Medication Sig Dispense Refill   Insulin  Human (INSULIN  PUMP) SOLN Inject 1 each into the skin continuous.     metFORMIN (GLUCOPHAGE) 500 MG tablet Take 500 mg by mouth 2 (two) times daily with a meal.     methocarbamol (ROBAXIN) 500 MG tablet Take 500 mg by mouth every 6 (six) hours as needed for muscle spasms.      tirzepatide (ZEPBOUND) 5 MG/0.5ML Pen Inject 5 mg into the skin once a week.     lisinopril  (ZESTRIL ) 10 MG tablet Take 1 tablet (10 mg total) by mouth daily. 90 tablet 3   rosuvastatin  (CRESTOR ) 5 MG tablet Take 1 tablet (5 mg total) by mouth daily. 90 tablet 3   No current facility-administered medications for this visit.     Objective:  BP 120/70 (BP Location: Left Arm, Patient Position: Sitting, Cuff Size: Normal)   Pulse 85   Temp 98.1 F (36.7 C) (Temporal)   Ht 5' 10 (1.778 m)   Wt 190 lb 6.4 oz (86.4 kg)   SpO2 98%   BMI 27.32 kg/m  Gen: NAD, resting comfortably CV: RRR no murmurs rubs or gallops Lungs: CTAB no crackles, wheeze, rhonchi Ext: no edema Skin: warm, dry, on right side of thoracic spine  7 x7 cm raised erythematous nodular area- profound induration and rather taught - tender to touch  Procedure:  Incision and  drainage of infected sebaceous cyst/abscess Risks, benefits, and alternatives explained and consent obtained. Surface cleaned with alcohol. 3.5  cc lidocaine with epinephine infiltrated around abscess. Adequate anesthesia ensured. Area prepped with betadine x 2, wiped with alcohol #11 blade used to make a stab incision into abscess.central to area of induration but to left of prior scar (we may have been encountering scar tissue from prior procedurs and infections Pus and capsule portions expressed with pressure. Curved hemostat used to explore 4 quadrants and loculations broken up to best of ability- this was challenging given depth of capsule itself was at least 0.5 cm . Further purulence expressed. No packing Hemostasis achieved. Pt stable. Aftercare and follow-up advised.     Assessment and Plan   # Infected sebaceous cyst S:patient has noted  2 cysts for sometime likely decades. He was doing a lot of yard work last week and felt like one of the two ripped- got sore, hot, red, painful to touch. Reports this area has drained surgically in past. 2/10 with gentle palpation- with attempted expression of drainage up to 6/10.  -no fever or chills A/P:  infected sebaceous cyst. Treat with I+D and with breadth of this and his diabetes we opted to cover with doxycycline . Also refer to general surgery given depth. With Point of  Care (POC) ultrasound depth seemed about 0.5 cm before even getting to cyst with total depth of 1.5 cm but rather broad.  Induration of total are 7x 7 cm so rather large and only did 1 cm opening.  Referral to surgery given depth and difficulty breaking up all loculations-able to express a fair amount of purulence and capsule but based on the ultrasound thought we would have greater total volume and I am concerned with depth if removal would be best before recurrence  # Diabetes-latent autoimmune diabetes of adulthood-managed with [facility] S: Medication:Metformin  500  mg twice daily- but can run low so sometimes doesn't take, insulin  pump tandem CIQ, zepbound 5 mg -He thinks he had a video visit 2 months ago the last thing I see is January- had had telemedicine visit on August 11 today that he can confirm.  Last A1c on file is from 2024. Scheduled in November including full labs Lab Results  Component Value Date   HGBA1C 8.4 04/25/2022   HGBA1C 8.1 06/14/2021   HGBA1C 8.6 10/25/2019  A/P: he is due for a1c but has full labs with [facility] next month and prefers to defer that for now- continue current medications and follow up with them. Congratulated on weight loss   #hypertension S: medication: Lisinopril  10 mg BP Readings from Last 3 Encounters:  11/16/23 120/70  06/27/22 128/78  01/24/21 124/62  A/P: well controlled continue current medications    #hyperlipidemia S: Medication:Rosuvastatin  5 mg daily  Lab Results  Component Value Date   CHOL 174 01/24/2021   HDL 35.10 (L) 01/24/2021   LDLDIRECT 131.0 01/24/2021   TRIG 219.0 (H) 01/24/2021   CHOLHDL 5 01/24/2021  A/P: overdue for lipids- states this will get done next month and they will send us  a copy   # Snoring S: Wife reports a lots of snoring.  He reports daytime somnolence.  We have referred [facility] to has not had time to see them yet- ended up losing weight on Mounjaro and snoring better.   A/P: doing better after weight loss- wants to monitor   # Health maintenance-now due for colonoscopy-referral placed today   Recommended follow up: Return in about 6 months (around 05/16/2024) for physical or sooner if needed.Schedule b4 you leave.  Lab/Order associations:   ICD-10-CM   1. Infected sebaceous cyst  L72.3    L08.9     2. LADA (latent autoimmune diabetes in adults), managed as type 1 (HCC)  E13.9 Microalbumin / creatinine urine ratio    3. Hyperlipidemia associated with type 2 diabetes mellitus (HCC)  E11.69    E78.5     4. Essential hypertension  I10     5. Screen  for colon cancer  Z12.11 Ambulatory referral to Gastroenterology      Meds ordered this encounter  Medications   lisinopril  (ZESTRIL ) 10 MG tablet    Sig: Take 1 tablet (10 mg total) by mouth daily.    Dispense:  90 tablet    Refill:  3   rosuvastatin  (CRESTOR ) 5 MG tablet    Sig: Take 1 tablet (5 mg total) by mouth daily.    Dispense:  90 tablet    Refill:  3    Return precautions advised.  Garnette Lukes, MD

## 2023-11-16 NOTE — Telephone Encounter (Signed)
 FYI Only or Action Required?: Action required by provider: request for appointment.  Patient was last seen in primary care on 06/27/2022 by Katrinka Garnette KIDD, MD.  Called Nurse Triage reporting Mass.  Symptoms began several days ago.  Interventions attempted: Nothing.  Symptoms are: gradually worsening. Area of redness is 4 inches x 4 inches.  Triage Disposition: See HCP Within 4 Hours (Or PCP Triage)  Patient/caregiver understands and will follow disposition?: YesCopied from CRM [id number]. Topic: Clinical - Red Word Triage >> Nov 16, 2023  8:39 AM Suzen RAMAN wrote: Red Word that prompted transfer to Nurse Triage: infection on back(hot to the touch,Red, Extremely painful) Answer Assessment - Initial Assessment Questions 1. APPEARANCE of SWELLING: What does it look like?     redness 2. SIZE: How large is the swelling? (e.g., inches, cm; or compare to size of pinhead, tip of pen, eraser, coin, pea, grape, ping pong ball)      4 inch x 4 inch 3. LOCATION: Where is the swelling located?     middle 4. ONSET: When did the swelling start?     weekend 5. COLOR: What color is it? Is there more than one color?     red 6. PAIN: Is there any pain? If Yes, ask: How bad is the pain? (Scale 1-10; or mild, moderate, severe)       severe 7. ITCH: Does it itch? If Yes, ask: How bad is the itch?      yes 8. CAUSE: What do you think caused the swelling?     unsure 9 OTHER SYMPTOMS: Do you have any other symptoms? (e.g., fever)     no  Protocols used: Skin Lump or Localized Swelling-A-AH  Reason for Disposition  [1] Swelling is red AND [2] size > 2 inches (5 cm)  (Exception: Itchy area of skin.)  Answer Assessment - Initial Assessment Questions 1. APPEARANCE of SWELLING: What does it look like?     redness 2. SIZE: How large is the swelling? (e.g., inches, cm; or compare to size of pinhead, tip of pen, eraser, coin, pea, grape, ping pong ball)      4 inch x 4  inch 3. LOCATION: Where is the swelling located?     middle 4. ONSET: When did the swelling start?     weekend 5. COLOR: What color is it? Is there more than one color?     red 6. PAIN: Is there any pain? If Yes, ask: How bad is the pain? (Scale 1-10; or mild, moderate, severe)       severe 7. ITCH: Does it itch? If Yes, ask: How bad is the itch?      yes 8. CAUSE: What do you think caused the swelling?     unsure 9 OTHER SYMPTOMS: Do you have any other symptoms? (e.g., fever)     no  Protocols used: Skin Lump or Localized Swelling-A-AH

## 2023-11-16 NOTE — Telephone Encounter (Signed)
 Patient scheduled to see Dr. Katrinka 10am today

## 2023-11-19 DIAGNOSIS — L723 Sebaceous cyst: Secondary | ICD-10-CM | POA: Diagnosis not present

## 2023-11-19 DIAGNOSIS — L0889 Other specified local infections of the skin and subcutaneous tissue: Secondary | ICD-10-CM | POA: Diagnosis not present

## 2023-12-14 ENCOUNTER — Ambulatory Visit: Payer: Self-pay | Admitting: Surgery

## 2023-12-14 DIAGNOSIS — L723 Sebaceous cyst: Secondary | ICD-10-CM | POA: Diagnosis not present

## 2023-12-14 DIAGNOSIS — L089 Local infection of the skin and subcutaneous tissue, unspecified: Secondary | ICD-10-CM | POA: Diagnosis not present

## 2023-12-16 DIAGNOSIS — Z6830 Body mass index (BMI) 30.0-30.9, adult: Secondary | ICD-10-CM | POA: Diagnosis not present

## 2023-12-16 DIAGNOSIS — E139 Other specified diabetes mellitus without complications: Secondary | ICD-10-CM | POA: Diagnosis not present

## 2023-12-16 DIAGNOSIS — Z9641 Presence of insulin pump (external) (internal): Secondary | ICD-10-CM | POA: Diagnosis not present

## 2024-01-20 ENCOUNTER — Encounter (HOSPITAL_COMMUNITY): Payer: Self-pay | Admitting: Surgery

## 2024-01-20 ENCOUNTER — Other Ambulatory Visit: Payer: Self-pay

## 2024-01-20 NOTE — Anesthesia Preprocedure Evaluation (Addendum)
 Anesthesia Evaluation  Patient identified by MRN, date of birth, ID band Patient awake    Reviewed: Allergy & Precautions, NPO status , Patient's Chart, lab work & pertinent test results  History of Anesthesia Complications Negative for: history of anesthetic complications  Airway Mallampati: II  TM Distance: >3 FB Neck ROM: Full    Dental  (+) Dental Advisory Given, Chipped   Pulmonary neg pulmonary ROS   Pulmonary exam normal        Cardiovascular hypertension, Pt. on medications Normal cardiovascular exam     Neuro/Psych negative neurological ROS  negative psych ROS   GI/Hepatic negative GI ROS, Neg liver ROS,,,  Endo/Other  diabetes, Insulin  Dependent, Oral Hypoglycemic Agents   On GLP-1a Insulin  pump  Renal/GU negative Renal ROS     Musculoskeletal  (+) Arthritis ,    Abdominal   Peds  Hematology negative hematology ROS (+)   Anesthesia Other Findings   Reproductive/Obstetrics                              Anesthesia Physical Anesthesia Plan  ASA: 3  Anesthesia Plan: General   Post-op Pain Management: Tylenol  PO (pre-op)* and Celebrex  PO (pre-op)*   Induction: Intravenous  PONV Risk Score and Plan: 2 and Treatment may vary due to age or medical condition, Ondansetron  and Midazolam   Airway Management Planned: Oral ETT  Additional Equipment: None  Intra-op Plan:   Post-operative Plan: Extubation in OR  Informed Consent: I have reviewed the patients History and Physical, chart, labs and discussed the procedure including the risks, benefits and alternatives for the proposed anesthesia with the patient or authorized representative who has indicated his/her understanding and acceptance.     Dental advisory given  Plan Discussed with: CRNA and Anesthesiologist  Anesthesia Plan Comments: (PAT note written 01/20/2024 by Allison Zelenak, PA-C.  )          Anesthesia Quick Evaluation

## 2024-01-20 NOTE — Progress Notes (Addendum)
 PCP - Katrinka Garnette KIDD, MD  Cardiologist -  Endocrinology -Ozell Charlie Runner, MD   PPM/ICD - denies Device Orders - n/a Rep Notified - n/a  Chest x-ray - denies EKG - denies Stress Test - denies ECHO - denies Cardiac Cath - denies  CPAP - denies  GLP-1 -tirzepatide (ZEPBOUND) Last dose 01-13-24. Patient knows to hold dose today  Fasting Blood Sugar - per patient blood sugar ranges between 90-100 in am.  Patient also has a Tandem insulin  pump he reports that he very familiar with pump. States he will reduce basal rate tonight. A1C on 01-15-24 7.7  Blood Thinner Instructions: denies Aspirin Instructions: denies  ERAS Protcol - clear liquids until 4:30 am  COVID TEST- no  Anesthesia review: Yes, hx of DM 1 LADA, HTN  Patient verbally denies any shortness of breath, fever, cough and chest pain during phone call   -------------  SDW INSTRUCTIONS given:  Your procedure is scheduled on January 21, 2024.  Report to [facility] Main Entrance A at 5:30 A.M., and check in at the Admitting office.  Call this number if you have problems the morning of surgery:  [phone]   Remember:  Do not eat after midnight the night before your surgery  You may drink clear liquids until 4:30 the morning of your surgery.   Clear liquids allowed are: Water, Non-Citrus Juices (without pulp), Carbonated Beverages, Clear Tea, Black Coffee Only, and Gatorade    Take these medicines the morning of surgery with A SIP OF WATER  methocarbamol (ROBAXIN)  rosuvastatin  (CRESTOR )     WHAT DO I DO ABOUT MY DIABETES MEDICATION?   Do not take oral diabetes medicines (pills) the morning of surgery.  Tandem insulin  pump Patient is going to reduce basal rate by 20%. Per patient he very familiar with how to manage insulin  pump. Also states that he will have a small increase in carbs. Tonight.  The day of surgery, do not take other diabetes injectables, including Byetta (exenatide), Bydureon  (exenatide ER), Victoza (liraglutide), or Trulicity (dulaglutide).  If your CBG is greater than 220 mg/dL, you may take  of your sliding scale (correction) dose of insulin .   HOW TO MANAGE YOUR DIABETES BEFORE AND AFTER SURGERY  Why is it important to control my blood sugar before and after surgery? Improving blood sugar levels before and after surgery helps healing and can limit problems. A way of improving blood sugar control is eating a healthy diet by:  Eating less sugar and carbohydrates  Increasing activity/exercise  Talking with your doctor about reaching your blood sugar goals High blood sugars (greater than 180 mg/dL) can raise your risk of infections and slow your recovery, so you will need to focus on controlling your diabetes during the weeks before surgery. Make sure that the doctor who takes care of your diabetes knows about your planned surgery including the date and location.  How do I manage my blood sugar before surgery? Check your blood sugar at least 4 times a day, starting 2 days before surgery, to make sure that the level is not too high or low.  Check your blood sugar the morning of your surgery when you wake up and every 2 hours until you get to the Short Stay unit.  If your blood sugar is less than 70 mg/dL, you will need to treat for low blood sugar: Do not take insulin . Treat a low blood sugar (less than 70 mg/dL) with  cup of clear juice (cranberry  or apple), 4 glucose tablets, OR glucose gel. Recheck blood sugar in 15 minutes after treatment (to make sure it is greater than 70 mg/dL). If your blood sugar is not greater than 70 mg/dL on recheck, call [phone] for further instructions. Report your blood sugar to the short stay nurse when you get to Short Stay.  If you are admitted to the hospital after surgery: Your blood sugar will be checked by the staff and you will probably be given insulin  after surgery (instead of oral diabetes medicines) to make  sure you have good blood sugar levels. The goal for blood sugar control after surgery is 80-180 mg/dL.    As of today, STOP taking any Aspirin (unless otherwise instructed by your surgeon) Aleve, Naproxen, Ibuprofen, Motrin, Advil, Goody's, BC's, all herbal medications, fish oil, and all vitamins.                      Do not wear jewelry, make up, or nail polish            Do not wear lotions, powders, perfumes/colognes, or deodorant.            Do not shave 48 hours prior to surgery.  Men may shave face and neck.            Do not bring valuables to the hospital.            [facility] is not responsible for any belongings or valuables.  Do NOT Smoke (Tobacco/Vaping) 24 hours prior to your procedure If you use a CPAP at night, you may bring all equipment for your overnight stay.   Contacts, glasses, dentures or bridgework may not be worn into surgery.      For patients admitted to the hospital, discharge time will be determined by your treatment team.   Patients discharged the day of surgery will not be allowed to drive home, and someone needs to stay with them for 24 hours.    Special instructions:   [place]- Preparing For Surgery  Before surgery, you can play an important role. Because skin is not sterile, your skin needs to be as free of germs as possible. You can reduce the number of germs on your skin by washing with CHG (chlorahexidine gluconate) Soap before surgery.  CHG is an antiseptic cleaner which kills germs and bonds with the skin to continue killing germs even after washing.    Oral Hygiene is also important to reduce your risk of infection.  Remember - BRUSH YOUR TEETH THE MORNING OF SURGERY WITH YOUR REGULAR TOOTHPASTE  Please do not use if you have an allergy to CHG or antibacterial soaps. If your skin becomes reddened/irritated stop using the CHG.  Do not shave (including legs and underarms) for at least 48 hours prior to first CHG shower. It is OK to shave your  face.  Please follow these instructions carefully.   Shower the NIGHT BEFORE SURGERY and the MORNING OF SURGERY with DIAL Soap.   Pat yourself dry with a CLEAN TOWEL.  Wear CLEAN PAJAMAS to bed the night before surgery  Place CLEAN SHEETS on your bed the night of your first shower and DO NOT SLEEP WITH PETS.   Day of Surgery: Please shower morning of surgery  Wear Clean/Comfortable clothing the morning of surgery Do not apply any deodorants/lotions.   Remember to brush your teeth WITH YOUR REGULAR TOOTHPASTE.   Questions were answered. Patient verbalized understanding of instructions.

## 2024-01-20 NOTE — Progress Notes (Signed)
 Anesthesia Chart Review: CANDELARIA CLAM WORK-UP   Case: [id number] Date/Time: 01/21/24 0715   Procedures:      EXCISION, LESION, BACK (Left) - EXCISION SEBACEOUS CYST LT SHOULDER AND BACK     EXCISION, MASS, UPPER EXTREMITY (Left)   Anesthesia type: General   Pre-op diagnosis: SEBACEOUS CYSTS   [facility] ROOM 09 / [facility]   Surgeons: Vanderbilt Ned, MD       DISCUSSION: Patient is a 48 year old male scheduled for the above procedure.  History includes never smoker, HTN, latent autoimmune diabetes in adults (LADA) treated as DM 1 (diagnosed with DM2 in 2021, LADA in 2013), spinal surgery.  He has a Humalog 100 unit/mL insulin  pump.  Also on Zepbound and metformin.  Last Zepbound 01/13/2024.  A1c 7.7% on 12/16/2023.  Anesthesia team to evaluate on the day of surgery.  VS: Ht 5' 10 (1.778 m)   Wt 84.6 kg   BMI 26.76 kg/m   PROVIDERS: Katrinka Garnette KIDD, MD is PCP  Theadora Sharper, MD is endocrinologist   LABS: For day of surgery as indicated.  IMAGES:   EKG: For day of surgery as indicated. Last EKG noted showed NSR on 04/25/2019.   CV: N/A  Past Medical History:  Diagnosis Date   Arthritis    Diabetes mellitus without complication (HCC)    Hypertension     Past Surgical History:  Procedure Laterality Date   BACK SURGERY     x2   EYE SURGERY     as child- strabismus. Sees Dr. Mccuen   TONSILLECTOMY     in 21s    MEDICATIONS:  HUMALOG 100 UNIT/ML injection   lisinopril  (ZESTRIL ) 10 MG tablet   metFORMIN (GLUCOPHAGE-XR) 500 MG 24 hr tablet   methocarbamol (ROBAXIN) 500 MG tablet   rosuvastatin  (CRESTOR ) 5 MG tablet   Insulin  Human (INSULIN  PUMP) SOLN   tirzepatide (ZEPBOUND) 5 MG/0.5ML Pen    Isaiah Ruder, PA-C Surgical Short Stay/Anesthesiology [facility] Phone [phone] [facility] Phone [phone] 01/20/2024 1:35 PM

## 2024-01-21 ENCOUNTER — Encounter (HOSPITAL_COMMUNITY): Payer: Self-pay | Admitting: Surgery

## 2024-01-21 ENCOUNTER — Ambulatory Visit (HOSPITAL_COMMUNITY)
Admission: RE | Admit: 2024-01-21 | Discharge: 2024-01-21 | Disposition: A | Discharge status: Home / Self Care | Attending: Surgery | Admitting: Surgery

## 2024-01-21 ENCOUNTER — Encounter: Admission: RE | Disposition: A | Payer: Self-pay | Attending: Surgery

## 2024-01-21 ENCOUNTER — Ambulatory Visit (HOSPITAL_COMMUNITY): Admitting: Vascular Surgery

## 2024-01-21 DIAGNOSIS — L720 Epidermal cyst: Secondary | ICD-10-CM | POA: Diagnosis present

## 2024-01-21 DIAGNOSIS — Z79899 Other long term (current) drug therapy: Secondary | ICD-10-CM | POA: Diagnosis not present

## 2024-01-21 DIAGNOSIS — Z7984 Long term (current) use of oral hypoglycemic drugs: Secondary | ICD-10-CM | POA: Insufficient documentation

## 2024-01-21 DIAGNOSIS — Z9641 Presence of insulin pump (external) (internal): Secondary | ICD-10-CM | POA: Insufficient documentation

## 2024-01-21 DIAGNOSIS — Z8249 Family history of ischemic heart disease and other diseases of the circulatory system: Secondary | ICD-10-CM | POA: Diagnosis not present

## 2024-01-21 DIAGNOSIS — M1990 Unspecified osteoarthritis, unspecified site: Secondary | ICD-10-CM | POA: Insufficient documentation

## 2024-01-21 DIAGNOSIS — Z8349 Family history of other endocrine, nutritional and metabolic diseases: Secondary | ICD-10-CM | POA: Insufficient documentation

## 2024-01-21 DIAGNOSIS — Z794 Long term (current) use of insulin: Secondary | ICD-10-CM | POA: Diagnosis not present

## 2024-01-21 DIAGNOSIS — E7849 Other hyperlipidemia: Secondary | ICD-10-CM | POA: Insufficient documentation

## 2024-01-21 DIAGNOSIS — E1369 Other specified diabetes mellitus with other specified complication: Secondary | ICD-10-CM | POA: Insufficient documentation

## 2024-01-21 DIAGNOSIS — I152 Hypertension secondary to endocrine disorders: Secondary | ICD-10-CM | POA: Diagnosis not present

## 2024-01-21 HISTORY — PX: EXCISION OF BACK LESION: SHX6597

## 2024-01-21 HISTORY — PX: EXCISION, MASS, UPPER EXTREMITY: SHX7567

## 2024-01-21 LAB — BASIC METABOLIC PANEL WITH GFR
Anion gap: 6 (ref 5–15)
BUN: 13 mg/dL (ref 6–20)
CO2: 28 mmol/L (ref 22–32)
Calcium: 9.2 mg/dL (ref 8.9–10.3)
Chloride: 101 mmol/L (ref 98–111)
Creatinine, Ser: 0.9 mg/dL (ref 0.61–1.24)
GFR, Estimated: >60 mL/min (ref >60)
Glucose, Bld: 267 mg/dL — ABNORMAL HIGH (ref 70–99)
Potassium: 3.9 mmol/L (ref 3.5–5.1)
Sodium: 135 mmol/L (ref 135–145)

## 2024-01-21 LAB — CBC
HCT: 40 % (ref 39.0–52.0)
Hemoglobin: 13.7 g/dL (ref 13.0–17.0)
MCH: 29 pg (ref 26.0–34.0)
MCHC: 34.3 g/dL (ref 30.0–36.0)
MCV: 84.6 fL (ref 80.0–100.0)
Platelets: 289 K/uL (ref 150–400)
RBC: 4.73 MIL/uL (ref 4.22–5.81)
RDW: 12.4 % (ref 11.5–15.5)
WBC: 6.3 K/uL (ref 4.0–10.5)
nRBC: 0 % (ref 0.0–0.2)

## 2024-01-21 LAB — GLUCOSE, CAPILLARY
Glucose-Capillary: 276 mg/dL — ABNORMAL HIGH (ref 70–99)
Glucose-Capillary: 235 mg/dL — ABNORMAL HIGH (ref 70–99)
Glucose-Capillary: 205 mg/dL — ABNORMAL HIGH (ref 70–99)

## 2024-01-21 SURGERY — EXCISION, LESION, BACK
Anesthesia: General | Laterality: Left

## 2024-01-21 MED ORDER — CEFAZOLIN SODIUM-DEXTROSE 2-4 GM/100ML-% IV SOLN
2.0000 g | INTRAVENOUS | Status: AC
  Administered 2024-01-21: 08:00:00 2 g via INTRAVENOUS
  Filled 2024-01-21: qty 100

## 2024-01-21 MED ORDER — BUPIVACAINE-EPINEPHRINE 0.25% -1:200000 IJ SOLN
INTRAMUSCULAR | Status: DC | PRN
  Administered 2024-01-21: 08:00:00 16 mL

## 2024-01-21 MED ORDER — LIDOCAINE 2% (20 MG/ML) 5 ML SYRINGE
INTRAMUSCULAR | Status: DC | PRN
  Administered 2024-01-21: 08:00:00 60 mg via INTRAVENOUS

## 2024-01-21 MED ORDER — DOXYCYCLINE HYCLATE 100 MG PO CAPS: 100.0000 mg | ORAL_CAPSULE | Freq: Two times a day (BID) | ORAL | 0 refills | Status: AC

## 2024-01-21 MED ORDER — LIDOCAINE 2% (20 MG/ML) 5 ML SYRINGE
INTRAMUSCULAR | Status: AC
  Filled 2024-01-21: qty 5

## 2024-01-21 MED ORDER — OXYCODONE HCL 5 MG/5ML PO SOLN: 5.0000 mg | Freq: Once | ORAL | Status: DC | PRN

## 2024-01-21 MED ORDER — LACTATED RINGERS IV SOLN: INTRAVENOUS | Status: DC

## 2024-01-21 MED ORDER — MIDAZOLAM HCL 2 MG/2ML IJ SOLN
INTRAMUSCULAR | Status: AC
  Filled 2024-01-21: qty 2

## 2024-01-21 MED ORDER — FENTANYL CITRATE (PF) 100 MCG/2ML IJ SOLN: 25.0000 ug | INTRAMUSCULAR | Status: DC | PRN

## 2024-01-21 MED ORDER — CHLORHEXIDINE GLUCONATE 0.12 % MT SOLN
15.0000 mL | Freq: Once | OROMUCOSAL | Status: AC
  Administered 2024-01-21: 07:00:00 15 mL via OROMUCOSAL
  Filled 2024-01-21: qty 15

## 2024-01-21 MED ORDER — ORAL CARE MOUTH RINSE: 15.0000 mL | Freq: Once | OROMUCOSAL | Status: AC

## 2024-01-21 MED ORDER — ROCURONIUM BROMIDE 10 MG/ML (PF) SYRINGE
PREFILLED_SYRINGE | INTRAVENOUS | Status: AC
  Filled 2024-01-21: qty 10

## 2024-01-21 MED ORDER — CHLORHEXIDINE GLUCONATE CLOTH 2 % EX PADS: 6.0000 | MEDICATED_PAD | Freq: Once | CUTANEOUS | Status: DC

## 2024-01-21 MED ORDER — PHENYLEPHRINE 80 MCG/ML (10ML) SYRINGE FOR IV PUSH (FOR BLOOD PRESSURE SUPPORT)
PREFILLED_SYRINGE | INTRAVENOUS | Status: AC
  Filled 2024-01-21: qty 10

## 2024-01-21 MED ORDER — PROPOFOL 10 MG/ML IV BOLUS
INTRAVENOUS | Status: AC
  Filled 2024-01-21: qty 20

## 2024-01-21 MED ORDER — OXYCODONE HCL 5 MG PO TABS: 5.0000 mg | ORAL_TABLET | Freq: Four times a day (QID) | ORAL | 0 refills | Status: AC | PRN

## 2024-01-21 MED ORDER — PHENYLEPHRINE 80 MCG/ML (10ML) SYRINGE FOR IV PUSH (FOR BLOOD PRESSURE SUPPORT)
PREFILLED_SYRINGE | INTRAVENOUS | Status: DC | PRN
  Administered 2024-01-21 (×2): 80 ug via INTRAVENOUS
  Administered 2024-01-21: 08:00:00 160 ug via INTRAVENOUS

## 2024-01-21 MED ORDER — ONDANSETRON HCL 4 MG/2ML IJ SOLN
INTRAMUSCULAR | Status: AC
  Filled 2024-01-21: qty 2

## 2024-01-21 MED ORDER — PROPOFOL 10 MG/ML IV BOLUS
INTRAVENOUS | Status: DC | PRN
  Administered 2024-01-21: 08:00:00 200 mg via INTRAVENOUS

## 2024-01-21 MED ORDER — FENTANYL CITRATE (PF) 250 MCG/5ML IJ SOLN
INTRAMUSCULAR | Status: DC | PRN
  Administered 2024-01-21 (×2): 50 ug via INTRAVENOUS

## 2024-01-21 MED ORDER — ROCURONIUM BROMIDE 10 MG/ML (PF) SYRINGE
PREFILLED_SYRINGE | INTRAVENOUS | Status: DC | PRN
  Administered 2024-01-21: 08:00:00 50 mg via INTRAVENOUS

## 2024-01-21 MED ORDER — SODIUM CHLORIDE 0.9 % IV SOLN: 12.5000 mg | INTRAVENOUS | Status: DC | PRN

## 2024-01-21 MED ORDER — SUGAMMADEX SODIUM 200 MG/2ML IV SOLN
INTRAVENOUS | Status: AC
  Filled 2024-01-21: qty 2

## 2024-01-21 MED ORDER — DEXAMETHASONE SOD PHOSPHATE PF 10 MG/ML IJ SOLN
INTRAMUSCULAR | Status: DC | PRN
  Administered 2024-01-21: 08:00:00 10 mg via INTRAVENOUS

## 2024-01-21 MED ORDER — KETOROLAC TROMETHAMINE 30 MG/ML IJ SOLN
INTRAMUSCULAR | Status: AC
  Filled 2024-01-21: qty 1

## 2024-01-21 MED ORDER — FENTANYL CITRATE (PF) 100 MCG/2ML IJ SOLN
INTRAMUSCULAR | Status: AC
  Filled 2024-01-21: qty 2

## 2024-01-21 MED ORDER — BUPIVACAINE-EPINEPHRINE (PF) 0.25% -1:200000 IJ SOLN
INTRAMUSCULAR | Status: AC
  Filled 2024-01-21: qty 30

## 2024-01-21 MED ORDER — MIDAZOLAM HCL (PF) 2 MG/2ML IJ SOLN
INTRAMUSCULAR | Status: DC | PRN
  Administered 2024-01-21: 08:00:00 2 mg via INTRAVENOUS

## 2024-01-21 MED ORDER — OXYCODONE HCL 5 MG PO TABS: 5.0000 mg | ORAL_TABLET | Freq: Once | ORAL | Status: DC | PRN

## 2024-01-21 MED ORDER — AMISULPRIDE (ANTIEMETIC) 5 MG/2ML IV SOLN: 10.0000 mg | Freq: Once | INTRAVENOUS | Status: DC | PRN

## 2024-01-21 MED ADMIN — Celecoxib Cap 200 MG: 200 mg | ORAL | @ 07:00:00 | NDC 72241002405

## 2024-01-21 MED ADMIN — Ondansetron HCl Inj 4 MG/2ML (2 MG/ML): 4 mg | INTRAVENOUS | @ 08:00:00 | NDC 60505613005

## 2024-01-21 MED ADMIN — Acetaminophen Tab 500 MG: 1000 mg | ORAL | @ 07:00:00 | NDC 50580045711

## 2024-01-21 MED ADMIN — Sugammadex Sodium IV 200 MG/2ML (Base Equivalent): 150 mg | INTRAVENOUS | @ 09:00:00 | NDC 00006542302

## 2024-01-21 MED FILL — Acetaminophen Tab 500 MG: 1000.0000 mg | ORAL | Qty: 2 | Status: AC

## 2024-01-21 MED FILL — Neomycin-Bacitracin-Polymyxin Oint: CUTANEOUS | Qty: 14.17 | Status: AC

## 2024-01-21 MED FILL — Celecoxib Cap 200 MG: 200.0000 mg | ORAL | Qty: 1 | Status: AC

## 2024-01-21 SURGICAL SUPPLY — 34 items
BAG COUNTER SPONGE SURGICOUNT (BAG) ×1 IMPLANT
BLADE CLIPPER SURG (BLADE) IMPLANT
CHLORAPREP W/TINT 26 (MISCELLANEOUS) ×1 IMPLANT
COVER SURGICAL LIGHT HANDLE (MISCELLANEOUS) ×1 IMPLANT
DERMABOND ADVANCED .7 DNX12 (GAUZE/BANDAGES/DRESSINGS) ×1 IMPLANT
DRAPE LAPAROSCOPIC ABDOMINAL (DRAPES) IMPLANT
DRAPE LAPAROTOMY 100X72 PEDS (DRAPES) IMPLANT
ELECTRODE REM PT RTRN 9FT ADLT (ELECTROSURGICAL) ×1 IMPLANT
GLOVE BIO SURGEON STRL SZ8 (GLOVE) ×1 IMPLANT
GLOVE BIOGEL PI IND STRL 8 (GLOVE) ×1 IMPLANT
GOWN STRL REUS W/ TWL LRG LVL3 (GOWN DISPOSABLE) ×2 IMPLANT
GOWN STRL REUS W/ TWL XL LVL3 (GOWN DISPOSABLE) ×1 IMPLANT
KIT BASIN OR (CUSTOM PROCEDURE TRAY) ×1 IMPLANT
KIT TURNOVER KIT B (KITS) ×1 IMPLANT
NDL HYPO 22X1.5 SAFETY MO (MISCELLANEOUS) IMPLANT
NDL HYPO 25GX1X1/2 BEV (NEEDLE) ×1 IMPLANT
NEEDLE HYPO 22X1.5 SAFETY MO (MISCELLANEOUS) IMPLANT
NEEDLE HYPO 25GX1X1/2 BEV (NEEDLE) ×1 IMPLANT
PACK GENERAL/GYN (CUSTOM PROCEDURE TRAY) ×1 IMPLANT
PAD ARMBOARD POSITIONER FOAM (MISCELLANEOUS) ×1 IMPLANT
PENCIL SMOKE EVACUATOR (MISCELLANEOUS) ×1 IMPLANT
SOLN 0.9% NACL POUR BTL 1000ML (IV SOLUTION) ×1 IMPLANT
SPECIMEN JAR MEDIUM (MISCELLANEOUS) IMPLANT
SPIKE FLUID TRANSFER (MISCELLANEOUS) ×1 IMPLANT
SPONGE T-LAP 18X18 ~~LOC~~+RFID (SPONGE) ×1 IMPLANT
SUT ETHILON 1 TP 1 60 (SUTURE) IMPLANT
SUT ETHILON 2 0 FS 18 (SUTURE) IMPLANT
SUT MNCRL AB 4-0 PS2 18 (SUTURE) ×1 IMPLANT
SUT VIC AB 2-0 SH 27X BRD (SUTURE) ×1 IMPLANT
SUT VIC AB 2-0 SH 27XBRD (SUTURE) IMPLANT
SUT VIC AB 3-0 SH 27XBRD (SUTURE) ×1 IMPLANT
SYR CONTROL 10ML LL (SYRINGE) ×1 IMPLANT
TOWEL GREEN STERILE (TOWEL DISPOSABLE) ×1 IMPLANT
TOWEL GREEN STERILE FF (TOWEL DISPOSABLE) ×1 IMPLANT

## 2024-01-21 NOTE — Anesthesia Procedure Notes (Signed)
 Procedure Name: Intubation Date/Time: 01/21/2024 7:38 AM  Performed by: Julien Manus, CRNAPre-anesthesia Checklist: Patient identified, Emergency Drugs available, Suction available and Patient being monitored Patient Re-evaluated:Patient Re-evaluated prior to induction Oxygen Delivery Method: Circle System Utilized Preoxygenation: Pre-oxygenation with 100% oxygen Induction Type: IV induction Ventilation: Mask ventilation without difficulty Laryngoscope Size: Mac and 4 Grade View: Grade II Tube type: Oral Tube size: 7.5 mm Number of attempts: 1 Airway Equipment and Method: Stylet and Oral airway Placement Confirmation: ETT inserted through vocal cords under direct vision, positive ETCO2 and breath sounds checked- equal and bilateral Secured at: 22 cm Tube secured with: Tape Dental Injury: Teeth and Oropharynx as per pre-operative assessment

## 2024-01-21 NOTE — Transfer of Care (Signed)
 Immediate Anesthesia Transfer of Care Note  Patient: David Higgins  Procedure(s) Performed: EXCISION, LESION, BACK (Left) EXCISION, MASS, UPPER EXTREMITY (Left)  Patient Location: PACU  Anesthesia Type:General  Level of Consciousness: awake and alert   Airway & Oxygen Therapy: Patient Spontanous Breathing and Patient connected to face mask oxygen  Post-op Assessment: Report given to RN and Post -op Vital signs reviewed and stable  Post vital signs: Reviewed and stable  Last Vitals:  Vitals Value Taken Time  BP 119/81 01/21/24 09:00  Temp    Pulse 87 01/21/24 09:04  Resp 17 01/21/24 09:04  SpO2 98 % 01/21/24 09:04  Vitals shown include unfiled device data.  Last Pain:  Vitals:   01/21/24 0643  TempSrc:   PainSc: 0-No pain         Complications: No notable events documented.

## 2024-01-21 NOTE — Interval H&P Note (Signed)
 History and Physical Interval Note:  01/21/2024 7:17 AM  David Higgins  has presented today for surgery, with the diagnosis of SEBACEOUS CYSTS.  The various methods of treatment have been discussed with the patient and family. After consideration of risks, benefits and other options for treatment, the patient has consented to  Procedures with comments: EXCISION, LESION, BACK (Left) - EXCISION SEBACEOUS CYST LT SHOULDER AND BACK EXCISION, MASS, UPPER EXTREMITY (Left) as a surgical intervention.  The patient's history has been reviewed, patient examined, no change in status, stable for surgery.  I have reviewed the patient's chart and labs.  Questions were answered to the patient's satisfaction.     Toia Micale A Lashunta Frieden

## 2024-01-21 NOTE — Op Note (Signed)
 Preoperative diagnosis: Epidermal inclusion cyst central back measuring 4 cm x 5 cm subcutaneous, epidermal inclusion cyst left posterior shoulder 2 cm x 2 cm subcutaneous  Postop diagnosis: Same  Procedure: Excision of epidermal inclusion cyst x 2  Surgeon: Debby Shipper, MD  Anesthesia: General With 0.25% Marcaine  with epinephrine   EBL: Minimal  Specimen: Cyst as above  Drains: None  Indications for procedure: The patient is a 48 year old male with a chronically draining epidermal inclusion cyst over central back and a smaller cyst over his left scapula.  He comes in today for excision due to pain and discomfort.  Risks and benefits reviewed with the patient as well as expected postop course and recovery.The procedure has been discussed with the patient.  Alternative therapies have been discussed with the patient.  Operative risks include bleeding,  Infection,  Organ injury,  Nerve injury,  Blood vessel injury,  DVT,  Pulmonary embolism,  Death,  And possible reoperation.  Medical management risks include worsening of present situation.  The success of the procedure is 50 -90 % at treating patients symptoms.  The patient understands and agrees to proceed.     Description of procedure: The patient was met in the holding area questions were answered.  Both cyst were marked with the assistance of the patient.  He was taken back to the op room.  General anesthesia was initiated while he was on his bed.  He was then placed prone and padded appropriately.  His back was then prepped and draped in a sterile fashion timeout performed.  There were 2 cysts 1 in the central back which had signs of chronic ruptured inflammation.  It measured 4 x 5 cm and was centrally located.  The second was over his left scapula.  This measures approximately 2 cm in maximal diameter.  The left shoulder cyst was excised first.  An elliptical incision was made over the cyst.  Dissection was carried down to the skin into  the subcutaneous tissue.  The cyst was excised in its entirety.  Local anesthetic was infiltrated throughout and the deep tissue planes were approximated with 3-0 Vicryl.  4 Monocryl was used to close the skin in a subcuticular fashion.  The central cyst was then addressed.  This showed signs of rupture was much larger.  A vertically oriented incision was made.  The skin over was excised especially the sinus tract that went down to the cyst remnant.  This was excised and cut back to healthy subcutaneous fat.  There is no residual cyst left behind the cyst appeared to have ruptured already.  This was copiously irrigated.  The skin was mobilized circumferentially.  The deep tissue planes were approximately 0 Vicryl.  Combination of 3-0 Vicryl and 4-0 Monocryl was used to approximate the skin.  4 sutures of 2-0 and 1-0 nylon was used to approximate the central portion of the wound to take some the tension off.  Neosporin placed.  Dry dressings applied.  Dermabond placed on the left shoulder incision.  All counts were found to be correct.  He was then placed back on his bed supine extubated taken to recovery in satisfactory condition.

## 2024-01-21 NOTE — Discharge Instructions (Signed)
 Apply neosporin daily to incision  Shower tomorrow  Ok to drive tomorrow   Keep clean gauze over incision   Change daily

## 2024-01-21 NOTE — H&P (Signed)
 History of Present Illness: David Higgins is a 48 y.o. male who is seen today for follow-up of infected sebaceous cyst central back. He was seen 2 weeks ago in the urgent office for drainage and evaluation of this. Initially his PCP drained it but he came to follow-up here and presents today for discussion of definitive treatment. He was out working the yard and felt the cyst rupture. He was placed in antibiotics and his PCP lanced that he said. He was seen here in follow-up and then referred to me for evaluation for excision. The redness and pain have resolved but the cyst is still present. He has a second sebaceous cyst over his left scapula..    Review of Systems: A complete review of systems was obtained from the patient. I have reviewed this information and discussed as appropriate with the patient. See HPI as well for other ROS.    Medical History: Past Medical History:  Diagnosis Date  Atopic dermatitis  Chronic pain  HLD (hyperlipidemia)  HTN (hypertension)  LADA (latent autoimmune diabetes of adulthood) (CMS/HHS-HCC)  positive GAD, 10/2011  Strabismus   Patient Active Problem List  Diagnosis  LADA (latent autoimmune diabetes of adulthood) (CMS/HHS-HCC)  Chronic pain  Insulin  pump in place  Hyperlipidemia associated with type 2 diabetes mellitus (CMS/HHS-HCC)  Hypertension associated with diabetes (CMS/HHS-HCC)   Past Surgical History:  Procedure Laterality Date  APPENDECTOMY  LAMINECTOMY LUMBAR SPINE  STRABISMUS EYE SURGERY    Allergies  Allergen Reactions  Prednisone Unknown and Other (See Comments)  Elevated blood pressure Elevated blood pressure Elevated blood pressure   Current Outpatient Medications on File Prior to Visit  Medication Sig Dispense Refill  blood glucose diagnostic test strip Use 3 (three) times daily Use as instructed. 100 each 3  DEXCOM G6 TRANSMITTER Devi Use 1 Device every 3 (three) months 1 each 4  DEXCOM G6 TRANSMITTER Devi Change  every 3 months 1 each 3  insulin  GLARGINE (LANTUS SOLOSTAR) pen injector (concentration 100 units/mL) Inject 38 Units subcutaneously once daily For off pump plan. OK to substitute Semglee if it is formulary preferred. 15 mL 3  insulin  LISPRO (HUMALOG) injection (concentration 100 units/mL) 100 units daily For use in pump. 30 mL 11  lancets (ONETOUCH DELICA LANCETS) 33 gauge Misc Use 1 each 4 (four) times daily check blood glucose 4 times daily. 400 each 3  lisinopriL  (ZESTRIL ) 10 MG tablet Take 1 tablet (10 mg total) by mouth once daily 90 tablet 3  metFORMIN (GLUCOPHAGE-XR) 500 MG XR tablet Take 2 tablets (1,000 mg total) by mouth daily with dinner 60 tablet 12  methocarbamol (ROBAXIN) 500 MG tablet Take 500 mg by mouth as needed.  pen needle, diabetic (BD ULTRA-FINE NANO PEN NEEDLES) 32 gauge x 5/32 Ndle Use with lantus and novolog pens 4 shots a day. Use as directed 120 each 12  rosuvastatin  (CRESTOR ) 5 MG tablet Take 1 tablet (5 mg total) by mouth once daily 90 tablet 3  sub-q insulin  pump, CGM system Misc Use 1 piece continuously. 1 each 0  tirzepatide (ZEPBOUND) 5 mg/0.5 mL injection Inject 0.5 mLs (5 mg total) subcutaneously every 7 (seven) days 2 mL 5  blood-glucose sensor (DEXCOM G6 SENSOR) Devi Use 1 each every 10 (ten) days for 360 days 9 each 3  HUMALOG U-100 INSULIN  injection (concentration 100 units/mL) 100 units max daily dose via pump. 90 mL 3  insulin  ASPART (NOVOLOG) injection (concentration 100 units/mL) Use 100 units max daily dose via pump. 90 mL  3   No current facility-administered medications on file prior to visit.   Family History  Problem Relation Age of Onset  Breast cancer Mother  Cervical cancer Mother  Rheumatic fever Father  High blood pressure (Hypertension) Father  Hyperlipidemia (Elevated cholesterol) Father  Osteoporosis (Thinning of bones) Maternal Grandmother    Social History   Tobacco Use  Smoking Status Never  Smokeless Tobacco Never     Social History   Socioeconomic History  Marital status: Single  Tobacco Use  Smoking status: Never  Smokeless tobacco: Never  Vaping Use  Vaping status: Never Used  Substance and Sexual Activity  Alcohol use: Yes  Alcohol/week: 1.0 standard drink of alcohol  Types: 1 Standard drinks or equivalent per week  Drug use: No  Sexual activity: Yes  Partners: Female  Birth control/protection: Condom  Social History Narrative  Married. 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[employer])   Social Drivers of Health   Financial Resource Strain: Low Risk (11/16/2023)  Received from [facility]  Overall Financial Resource Strain (CARDIA)  How hard is it for you to pay for the very basics like food, housing, medical care, and heating?: Not very hard  Food Insecurity: No Food Insecurity (11/16/2023)  Received from [facility]  Hunger Vital Sign  Within the past 12 months, you worried that your food would run out before you got the money to buy more.: Never true  Within the past 12 months, the food you bought just didn't last and you didn't have money to get more.: Never true  Transportation Needs: No Transportation Needs (11/16/2023)  Received from [facility] - Transportation  In the past 12 months, has lack of transportation kept you from medical appointments or from getting medications?: No  In the past 12 months, has lack of transportation kept you from meetings, work, or from getting things needed for daily living?: No  Physical Activity: Insufficiently Active (11/16/2023)  Received from [facility]  Exercise Vital Sign  On average, how many days per week do you engage in moderate to strenuous exercise (like a brisk walk)?: 2 days  On average, how many minutes do you engage in exercise at this level?: 20 min  Stress: No Stress Concern Present (11/16/2023)  Received from [facility] - Occupational Stress Questionnaire  Do you feel stress - tense, restless, nervous, or anxious, or unable to sleep at night because your mind is troubled all the time - these days?: Only a little  Social Connections: Socially Integrated (11/16/2023)  Received from [facility]  Social Connection and Isolation Panel  In a typical week, how many times do you talk on the phone with family, friends, or neighbors?: More than three times a week  How often do you get together with friends or relatives?: Twice a week  How often do you attend church or religious services?: More than 4 times per year  Do you belong to any clubs or organizations such as church groups, unions, fraternal or athletic groups, or school groups?: Yes  How often do you attend meetings of the clubs or organizations you belong to?: More than 4 times per year  Are you married, widowed, divorced, separated, never married, or living with a partner?: Married  Housing Stability: Unknown (11/19/2023)  Housing Stability Vital Sign  Homeless in the Last Year: No   Objective:   Vitals:  12/14/23 1408  PainSc: 0-No  pain   There is no height or weight on file to calculate BMI.  Physical Exam Vitals reviewed.  Cardiovascular:  Rate and Rhythm: Normal rate.  Pulmonary:  Effort: Pulmonary effort is normal.  Musculoskeletal:  Cervical back: Normal range of motion.  Skin:  Comments: 4 cm x 4 cm central back sebaceous cyst with drainage incision noted. No active infection.  2 cm x 2 cm sebaceous cyst left upper back over left scapula/shoulder  Neurological:  Mental Status: He is alert.     Assessment and Plan:   Diagnoses and all orders for this visit:  Infected sebaceous cyst  4 cm x 4 cm central back   Sebaceous cyst 2 cm left shoulder   Patient desires excision of both cysts. Risks and benefits reviewed. Natural history of sebaceous cyst reviewed. Complications reviewed.  Patient wished to proceed. No  follow-ups on file.  DEBBY CURTISTINE SHIPPER, MD

## 2024-01-22 ENCOUNTER — Encounter (HOSPITAL_COMMUNITY): Payer: Self-pay | Admitting: Surgery

## 2024-01-22 LAB — SURGICAL PATHOLOGY

## 2024-01-22 NOTE — Anesthesia Postprocedure Evaluation (Signed)
"   Anesthesia Post Note  Patient: Jacarius Handel  Procedure(s) Performed: EXCISION, LESION, BACK (Left) EXCISION, MASS, UPPER EXTREMITY (Left)     Patient location during evaluation: PACU Anesthesia Type: General Level of consciousness: awake and alert Pain management: pain level controlled Vital Signs Assessment: post-procedure vital signs reviewed and stable Respiratory status: spontaneous breathing, nonlabored ventilation and respiratory function stable Cardiovascular status: stable and blood pressure returned to baseline Anesthetic complications: no   No notable events documented.  Last Vitals:  Vitals:   01/21/24 0915 01/21/24 0930  BP: 123/86 127/88  Pulse: 88 80  Resp: 12 18  Temp:  36.6 C  SpO2: 99% 98%    Last Pain:  Vitals:   01/21/24 0915  TempSrc:   PainSc: 0-No pain                 Debby FORBES Like      "

## 2024-01-23 ENCOUNTER — Telehealth: Admitting: Physician Assistant

## 2024-01-23 DIAGNOSIS — Z20828 Contact with and (suspected) exposure to other viral communicable diseases: Secondary | ICD-10-CM | POA: Diagnosis not present

## 2024-01-23 MED ORDER — OSELTAMIVIR PHOSPHATE 75 MG PO CAPS: 75.0000 mg | ORAL_CAPSULE | Freq: Two times a day (BID) | ORAL | 0 refills | Status: AC

## 2024-01-23 MED ORDER — PROMETHAZINE-DM 6.25-15 MG/5ML PO SYRP: 5.0000 mL | ORAL_SOLUTION | Freq: Four times a day (QID) | ORAL | 0 refills | Status: AC | PRN

## 2024-01-23 MED ORDER — BENZONATATE 100 MG PO CAPS: 100.0000 mg | ORAL_CAPSULE | Freq: Two times a day (BID) | ORAL | 0 refills | Status: AC | PRN

## 2024-01-23 NOTE — Progress Notes (Signed)
 E visit for Flu like symptoms   We are sorry that you are not feeling well.  Here is how we plan to help! Based on what you have shared with me it looks like you may have possible exposure to a virus that causes influenza.  Influenza or the flu is  an infection caused by a respiratory virus. The flu virus is highly contagious and persons who did not receive their yearly flu vaccination may catch the flu from close contact.  We have anti-viral medications to treat the viruses that cause this infection. They are not a cure and only shorten the course of the infection. These prescriptions are most effective when they are given within the first 2 days of flu symptoms. Antiviral medications are indicated if you have a high risk of complications from the flu. You should  also consider an antiviral medication if you are in close contact with someone who is at risk. These medications can help patients avoid complications from the flu but have side effects that you should know.   Possible side effects from Tamiflu  or oseltamivir  include nausea, vomiting, diarrhea, dizziness, headaches, eye redness, sleep problems or other respiratory symptoms. You should not take Tamiflu  if you have an allergy to oseltamivir  or any to the ingredients in Tamiflu .  Based upon your symptoms and potential risk factors I have prescribed Oseltamivir  (Tamiflu ).  It has been sent to your designated pharmacy.  You will take one 75 mg capsule orally twice a day for the next 5 days.   For nasal congestion, you may use an oral decongestant such as Mucinex D or if you have glaucoma or high blood pressure use plain Mucinex.  Saline nasal spray or nasal drops can help and can safely be used as often as needed for congestion.  If you have a sore or scratchy throat, use a saltwater gargle-  to  teaspoon of salt dissolved in a 4-ounce to 8-ounce glass of warm water.  Gargle the solution for approximately 15-30 seconds and then spit.   It is important not to swallow the solution.  You can also use throat lozenges/cough drops and Chloraseptic spray to help with throat pain or discomfort.  Warm or cold liquids can also be helpful in relieving throat pain.  For headache, pain or general discomfort, you can use Ibuprofen  or Tylenol  as directed.   Some authorities believe that zinc sprays or the use of Echinacea may shorten the course of your symptoms.  I have prescribed the following medications to help lessen symptoms: I have prescribed Tessalon  Perles 100 mg. You may take 1-2 capsules every 8 hours as needed for cough, I have prescribed Phenergan  DM 6.25 mg/15 mg. You make take one teaspoon / 5 ml every 4-6 hours as needed for cough, and I have prescribed Fluticasone  nasal spray 2 sprays in each nostril one time per dayasal spray 2 sprays in each nostril one time per day  You are to isolate at home until you have been fever-free for at least 24 hours without a fever-reducing medication, and symptoms have been steadily improving for 24 hours.  If you must be around other household members who do not have symptoms, you need to make sure that both you and the family members are masking consistently with a high-quality mask.  If you note any worsening of symptoms despite treatment, please seek an in-person evaluation ASAP. If you note any significant shortness of breath or any chest pain, please seek ED evaluation. Please  do not delay care!  ANYONE WHO HAS FLU SYMPTOMS SHOULD: Stay home. The flu is highly contagious and going out or to work exposes others! Be sure to drink plenty of fluids. Water is fine as well as fruit juices, sodas and electrolyte beverages. You may want to stay away from caffeine or alcohol. If you are nauseated, try taking small sips of liquids. How do you know if you are getting enough fluid? Your urine should be a pale yellow or almost colorless. Get rest. Taking a steamy shower or using a humidifier may help  nasal congestion and ease sore throat pain. Using a saline nasal spray works much the same way. Cough drops, hard candies and sore throat lozenges may ease your cough. Line up a caregiver. Have someone check on you regularly.  GET HELP RIGHT AWAY IF: You cannot keep down liquids or your medications. You become short of breath Your fell like you are going to pass out or loose consciousness. Your symptoms persist after you have completed your treatment plan  MAKE SURE YOU  Understand these instructions. Will watch your condition. Will get help right away if you are not doing well or get worse.  Your e-visit answers were reviewed by a board certified advanced clinical practitioner to complete your personal care plan.  Depending on the condition, your plan could have included both over the counter or prescription medications.  If there is a problem please reply  once you have received a response from your provider.  Your safety is important to us .  If you have drug allergies check your prescription carefully.    You can use MyChart to ask questions about todays visit, request a non-urgent call back, or ask for a work or school excuse for 24 hours related to this e-Visit. If it has been greater than 24 hours you will need to follow up with your provider, or enter a new e-Visit to address those concerns.  You will get an e-mail in the next two days asking about your experience.  I hope that your e-visit has been valuable and will speed your recovery. Thank you for using e-visits.   I have spent 5 minutes in review of e-visit questionnaire, review and updating patient chart, medical decision making and response to patient.   Teena Shuck, PA-C

## 2024-05-19 ENCOUNTER — Encounter: Admitting: Family Medicine
